# Patient Record
Sex: Female | Born: 1945 | Race: White | Hispanic: No | Smoking: Former smoker
Health system: Southern US, Community
[De-identification: ages and names within clinical notes are randomized; demographics above are authoritative.]

## PROBLEM LIST (undated history)

## (undated) DIAGNOSIS — R112 Nausea with vomiting, unspecified: Secondary | ICD-10-CM

## (undated) DIAGNOSIS — M1990 Unspecified osteoarthritis, unspecified site: Secondary | ICD-10-CM

## (undated) DIAGNOSIS — Z9889 Other specified postprocedural states: Secondary | ICD-10-CM

## (undated) DIAGNOSIS — C801 Malignant (primary) neoplasm, unspecified: Secondary | ICD-10-CM

## (undated) DIAGNOSIS — T8859XA Other complications of anesthesia, initial encounter: Secondary | ICD-10-CM

## (undated) DIAGNOSIS — T4145XA Adverse effect of unspecified anesthetic, initial encounter: Secondary | ICD-10-CM

## (undated) HISTORY — PX: EYE SURGERY: SHX253

## (undated) HISTORY — DX: Malignant (primary) neoplasm, unspecified: C80.1

## (undated) HISTORY — PX: TONSILLECTOMY: SUR1361

---

## 1998-01-29 ENCOUNTER — Other Ambulatory Visit: Admission: RE | Admit: 1998-01-29 | Discharge: 1998-01-29 | Payer: Self-pay | Admitting: Obstetrics and Gynecology

## 1999-02-06 ENCOUNTER — Other Ambulatory Visit: Admission: RE | Admit: 1999-02-06 | Discharge: 1999-02-06 | Payer: Self-pay | Admitting: Obstetrics and Gynecology

## 1999-04-11 ENCOUNTER — Ambulatory Visit (HOSPITAL_COMMUNITY): Admission: RE | Admit: 1999-04-11 | Discharge: 1999-04-11 | Payer: Self-pay | Admitting: Obstetrics and Gynecology

## 1999-04-11 ENCOUNTER — Encounter (INDEPENDENT_AMBULATORY_CARE_PROVIDER_SITE_OTHER): Payer: Self-pay

## 2000-02-20 ENCOUNTER — Other Ambulatory Visit: Admission: RE | Admit: 2000-02-20 | Discharge: 2000-02-20 | Payer: Self-pay | Admitting: Obstetrics and Gynecology

## 2000-05-06 ENCOUNTER — Other Ambulatory Visit: Admission: RE | Admit: 2000-05-06 | Discharge: 2000-05-06 | Payer: Self-pay | Admitting: Obstetrics and Gynecology

## 2000-05-06 ENCOUNTER — Encounter (INDEPENDENT_AMBULATORY_CARE_PROVIDER_SITE_OTHER): Payer: Self-pay | Admitting: Specialist

## 2000-08-16 ENCOUNTER — Other Ambulatory Visit: Admission: RE | Admit: 2000-08-16 | Discharge: 2000-08-16 | Payer: Self-pay | Admitting: Obstetrics and Gynecology

## 2001-02-21 ENCOUNTER — Other Ambulatory Visit: Admission: RE | Admit: 2001-02-21 | Discharge: 2001-02-21 | Payer: Self-pay | Admitting: Obstetrics and Gynecology

## 2002-06-08 ENCOUNTER — Other Ambulatory Visit: Admission: RE | Admit: 2002-06-08 | Discharge: 2002-06-08 | Payer: Self-pay | Admitting: Obstetrics and Gynecology

## 2003-06-08 ENCOUNTER — Other Ambulatory Visit: Admission: RE | Admit: 2003-06-08 | Discharge: 2003-06-08 | Payer: Self-pay | Admitting: Obstetrics and Gynecology

## 2003-09-03 ENCOUNTER — Other Ambulatory Visit: Admission: RE | Admit: 2003-09-03 | Discharge: 2003-09-03 | Payer: Self-pay | Admitting: Obstetrics and Gynecology

## 2005-07-29 ENCOUNTER — Encounter: Admission: RE | Admit: 2005-07-29 | Discharge: 2005-07-29 | Payer: Self-pay | Admitting: Orthopedic Surgery

## 2009-04-27 DIAGNOSIS — C801 Malignant (primary) neoplasm, unspecified: Secondary | ICD-10-CM

## 2009-04-27 HISTORY — PX: AXILLARY SURGERY: SHX892

## 2009-04-27 HISTORY — PX: BREAST SURGERY: SHX581

## 2009-04-27 HISTORY — DX: Malignant (primary) neoplasm, unspecified: C80.1

## 2009-08-29 ENCOUNTER — Encounter: Admission: RE | Admit: 2009-08-29 | Discharge: 2009-08-29 | Payer: Self-pay | Admitting: General Surgery

## 2009-08-30 ENCOUNTER — Encounter: Admission: RE | Admit: 2009-08-30 | Discharge: 2009-08-30 | Payer: Self-pay | Admitting: General Surgery

## 2009-09-10 ENCOUNTER — Ambulatory Visit (HOSPITAL_BASED_OUTPATIENT_CLINIC_OR_DEPARTMENT_OTHER): Admission: RE | Admit: 2009-09-10 | Discharge: 2009-09-11 | Payer: Self-pay | Admitting: General Surgery

## 2009-09-18 ENCOUNTER — Ambulatory Visit: Payer: Self-pay | Admitting: Oncology

## 2009-09-25 LAB — COMPREHENSIVE METABOLIC PANEL
Albumin: 4 g/dL (ref 3.5–5.2)
BUN: 12 mg/dL (ref 6–23)
CO2: 28 mEq/L (ref 19–32)
Calcium: 9.5 mg/dL (ref 8.4–10.5)
Chloride: 102 mEq/L (ref 96–112)
Potassium: 4.2 mEq/L (ref 3.5–5.3)
Total Protein: 6.2 g/dL (ref 6.0–8.3)

## 2009-09-25 LAB — CBC WITH DIFFERENTIAL/PLATELET
Eosinophils Absolute: 0.1 10*3/uL (ref 0.0–0.5)
HCT: 40.6 % (ref 34.8–46.6)
MCHC: 34.7 g/dL (ref 31.5–36.0)
MCV: 91.6 fL (ref 79.5–101.0)
MONO#: 0.7 10*3/uL (ref 0.1–0.9)
NEUT#: 6.6 10*3/uL — ABNORMAL HIGH (ref 1.5–6.5)
NEUT%: 73.5 % (ref 38.4–76.8)
RBC: 4.43 10*6/uL (ref 3.70–5.45)
RDW: 11.8 % (ref 11.2–14.5)

## 2009-10-10 ENCOUNTER — Ambulatory Visit (HOSPITAL_COMMUNITY): Admission: RE | Admit: 2009-10-10 | Discharge: 2009-10-10 | Payer: Self-pay | Admitting: Oncology

## 2009-11-01 ENCOUNTER — Ambulatory Visit: Admission: RE | Admit: 2009-11-01 | Discharge: 2009-12-26 | Payer: Self-pay | Admitting: Radiation Oncology

## 2009-12-25 ENCOUNTER — Ambulatory Visit: Payer: Self-pay | Admitting: Oncology

## 2009-12-27 LAB — CBC WITH DIFFERENTIAL/PLATELET
BASO%: 0.3 % (ref 0.0–2.0)
Basophils Absolute: 0 10*3/uL (ref 0.0–0.1)
EOS%: 2.3 % (ref 0.0–7.0)
MCH: 30.9 pg (ref 25.1–34.0)
MCHC: 33.8 g/dL (ref 31.5–36.0)
MONO#: 0.5 10*3/uL (ref 0.1–0.9)
MONO%: 10.2 % (ref 0.0–14.0)
Platelets: 135 10*3/uL — ABNORMAL LOW (ref 145–400)
RBC: 4.83 10*6/uL (ref 3.70–5.45)
WBC: 4.5 10*3/uL (ref 3.9–10.3)
lymph#: 0.7 10*3/uL — ABNORMAL LOW (ref 0.9–3.3)

## 2010-02-17 ENCOUNTER — Ambulatory Visit: Payer: Self-pay | Admitting: Oncology

## 2010-02-19 LAB — CBC WITH DIFFERENTIAL/PLATELET
BASO%: 0.5 % (ref 0.0–2.0)
Basophils Absolute: 0 10*3/uL (ref 0.0–0.1)
Eosinophils Absolute: 0.2 10*3/uL (ref 0.0–0.5)
HGB: 14.1 g/dL (ref 11.6–15.9)
MCV: 89.9 fL (ref 79.5–101.0)
WBC: 5.1 10*3/uL (ref 3.9–10.3)
lymph#: 1.2 10*3/uL (ref 0.9–3.3)

## 2010-02-19 LAB — COMPREHENSIVE METABOLIC PANEL
AST: 20 U/L (ref 0–37)
Alkaline Phosphatase: 50 U/L (ref 39–117)
CO2: 33 mEq/L — ABNORMAL HIGH (ref 19–32)
Creatinine, Ser: 0.7 mg/dL (ref 0.40–1.20)
Glucose, Bld: 109 mg/dL — ABNORMAL HIGH (ref 70–99)
Total Bilirubin: 1.5 mg/dL — ABNORMAL HIGH (ref 0.3–1.2)

## 2010-02-19 LAB — MORPHOLOGY: PLT EST: ADEQUATE

## 2010-02-19 LAB — CHCC SMEAR

## 2010-02-20 LAB — VITAMIN D 25 HYDROXY (VIT D DEFICIENCY, FRACTURES): Vit D, 25-Hydroxy: 56 ng/mL (ref 30–89)

## 2010-03-26 ENCOUNTER — Ambulatory Visit: Payer: Self-pay | Admitting: Oncology

## 2010-08-21 ENCOUNTER — Ambulatory Visit: Payer: Medicare Other | Attending: Radiation Oncology | Admitting: Radiation Oncology

## 2010-10-06 ENCOUNTER — Encounter (HOSPITAL_BASED_OUTPATIENT_CLINIC_OR_DEPARTMENT_OTHER): Payer: Medicare Other | Admitting: Oncology

## 2010-10-06 ENCOUNTER — Other Ambulatory Visit: Payer: Self-pay | Admitting: Oncology

## 2010-10-06 DIAGNOSIS — Z170 Estrogen receptor positive status [ER+]: Secondary | ICD-10-CM

## 2010-10-06 DIAGNOSIS — C50619 Malignant neoplasm of axillary tail of unspecified female breast: Secondary | ICD-10-CM

## 2010-10-06 LAB — CBC WITH DIFFERENTIAL/PLATELET
BASO%: 0.7 % (ref 0.0–2.0)
Basophils Absolute: 0 10*3/uL (ref 0.0–0.1)
HGB: 14.1 g/dL (ref 11.6–15.9)
MONO%: 8.2 % (ref 0.0–14.0)
NEUT#: 3.2 10*3/uL (ref 1.5–6.5)
NEUT%: 56.5 % (ref 38.4–76.8)
Platelets: 140 10*3/uL — ABNORMAL LOW (ref 145–400)
RBC: 4.65 10*6/uL (ref 3.70–5.45)
lymph#: 1.8 10*3/uL (ref 0.9–3.3)

## 2010-10-06 LAB — COMPREHENSIVE METABOLIC PANEL
AST: 16 U/L (ref 0–37)
Albumin: 4 g/dL (ref 3.5–5.2)
Alkaline Phosphatase: 72 U/L (ref 39–117)
BUN: 9 mg/dL (ref 6–23)
CO2: 30 mEq/L (ref 19–32)
Chloride: 101 mEq/L (ref 96–112)
Creatinine, Ser: 0.72 mg/dL (ref 0.50–1.10)
Glucose, Bld: 85 mg/dL (ref 70–99)
Sodium: 139 mEq/L (ref 135–145)
Total Bilirubin: 0.7 mg/dL (ref 0.3–1.2)

## 2010-10-07 LAB — VITAMIN D 25 HYDROXY (VIT D DEFICIENCY, FRACTURES): Vit D, 25-Hydroxy: 55 ng/mL (ref 30–89)

## 2011-03-31 ENCOUNTER — Other Ambulatory Visit (HOSPITAL_BASED_OUTPATIENT_CLINIC_OR_DEPARTMENT_OTHER): Payer: Medicare Other | Admitting: Lab

## 2011-03-31 ENCOUNTER — Other Ambulatory Visit: Payer: Self-pay | Admitting: Oncology

## 2011-03-31 DIAGNOSIS — C50619 Malignant neoplasm of axillary tail of unspecified female breast: Secondary | ICD-10-CM

## 2011-03-31 DIAGNOSIS — Z170 Estrogen receptor positive status [ER+]: Secondary | ICD-10-CM

## 2011-03-31 LAB — CBC WITH DIFFERENTIAL/PLATELET
Basophils Absolute: 0 10*3/uL (ref 0.0–0.1)
HCT: 44.9 % (ref 34.8–46.6)
MONO#: 0.3 10*3/uL (ref 0.1–0.9)
NEUT#: 3.6 10*3/uL (ref 1.5–6.5)
NEUT%: 70.1 % (ref 38.4–76.8)
RBC: 4.99 10*6/uL (ref 3.70–5.45)
RDW: 12.4 % (ref 11.2–14.5)
lymph#: 1 10*3/uL (ref 0.9–3.3)

## 2011-04-01 LAB — COMPREHENSIVE METABOLIC PANEL
Albumin: 4.3 g/dL (ref 3.5–5.2)
Alkaline Phosphatase: 74 U/L (ref 39–117)
BUN: 13 mg/dL (ref 6–23)
CO2: 28 mEq/L (ref 19–32)
Chloride: 102 mEq/L (ref 96–112)
Sodium: 139 mEq/L (ref 135–145)
Total Bilirubin: 1.4 mg/dL — ABNORMAL HIGH (ref 0.3–1.2)

## 2011-04-01 LAB — VITAMIN D 25 HYDROXY (VIT D DEFICIENCY, FRACTURES): Vit D, 25-Hydroxy: 64 ng/mL (ref 30–89)

## 2011-04-07 ENCOUNTER — Ambulatory Visit (HOSPITAL_BASED_OUTPATIENT_CLINIC_OR_DEPARTMENT_OTHER): Payer: Medicare Other | Admitting: Oncology

## 2011-04-07 DIAGNOSIS — M25519 Pain in unspecified shoulder: Secondary | ICD-10-CM

## 2011-04-07 DIAGNOSIS — C50619 Malignant neoplasm of axillary tail of unspecified female breast: Secondary | ICD-10-CM

## 2011-04-07 DIAGNOSIS — E559 Vitamin D deficiency, unspecified: Secondary | ICD-10-CM

## 2011-04-07 DIAGNOSIS — Z170 Estrogen receptor positive status [ER+]: Secondary | ICD-10-CM

## 2011-04-07 DIAGNOSIS — C50919 Malignant neoplasm of unspecified site of unspecified female breast: Secondary | ICD-10-CM

## 2011-04-07 NOTE — Progress Notes (Signed)
Hematology and Oncology Follow Up Visit  Sarah Lester [id number] 30-May-1945 65 y.o. 04/07/2011 2:23 PM   Principle Diagnosis: 65 year old woman with history of T1 C. N1, grade 1, ER/PR positive breast cancer, status post lumpectomy and radiation completed August 2011, couldn't currently on Arimidex. Interim History:  She is doing well. 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 She appears to be tolerating her Arimidex well with minimal side effects. She will have a followup mammogram in April of next year. Overall review of systems negative  Medications: I have reviewed the patient's current medications.  Allergies: No Known Allergies  Past Medical History, Surgical history, Social history, and Family History were reviewed and updated.  Review of Systems: Constitutional:  Negative for fever, chills, night sweats, anorexia, weight loss, pain. Cardiovascular: no chest pain or dyspnea on exertion Respiratory: no cough, shortness of breath, or wheezing Neurological: no TIA or stroke symptoms Dermatological: negative ENT: negative Skin Gastrointestinal: no abdominal pain, change in bowel habits, or black or bloody stools Genito-Urinary: no dysuria, trouble voiding, or hematuria Hematological and Lymphatic: negative Breast: negative Musculoskeletal: negative Remaining ROS negative.  Physical Exam: Blood pressure 129/70, pulse 73, temperature 98.3 F (36.8 C), temperature source Oral, height 5\' 3"  (1.6 m), weight 126 lb 4.8 oz (57.289 kg). ECOG: 0 General appearance: alert, cooperative and appears stated age Head: Normocephalic, without obvious abnormality, atraumatic Neck: no adenopathy, no carotid bruit, no JVD, supple, symmetrical, trachea midline and thyroid not enlarged, symmetric, no tenderness/mass/nodules Lymph nodes: Cervical, supraclavicular, and axillary nodes normal. Cardiac : Normal Pulmonary: Normal Breasts: Right and left breasts  are free of any masses, there is no nipple retraction or skin changes. Surgical scar is healed well. Both axilla normal Abdomen: Normal Extremities normal  Neuro: Normal  Lab Results: Lab Results  Component Value Date   WBC 5.2 03/31/2011   HGB 15.3 03/31/2011   HCT 44.9 03/31/2011   MCV 89.8 03/31/2011   PLT 147 03/31/2011     Chemistry      Component Value Date/Time   NA 139 03/31/2011 0818   NA 139 03/31/2011 0818   NA 139 03/31/2011 0818   K 4.0 03/31/2011 0818   K 4.0 03/31/2011 0818   K 4.0 03/31/2011 0818   CL 102 03/31/2011 0818   CL 102 03/31/2011 0818   CL 102 03/31/2011 0818   CO2 28 03/31/2011 0818   CO2 28 03/31/2011 0818   CO2 28 03/31/2011 0818   BUN 13 03/31/2011 0818   BUN 13 03/31/2011 0818   BUN 13 03/31/2011 0818   CREATININE 0.79 03/31/2011 0818   CREATININE 0.79 03/31/2011 0818   CREATININE 0.79 03/31/2011 0818      Component Value Date/Time   CALCIUM 9.6 03/31/2011 0818   CALCIUM 9.6 03/31/2011 0818   CALCIUM 9.6 03/31/2011 0818   ALKPHOS 74 03/31/2011 0818   ALKPHOS 74 03/31/2011 0818   ALKPHOS 74 03/31/2011 0818   AST 15 03/31/2011 0818   AST 15 03/31/2011 0818   AST 15 03/31/2011 0818   ALT 15 03/31/2011 0818   ALT 15 03/31/2011 0818   ALT 15 03/31/2011 0818   BILITOT 1.4* 03/31/2011 0818   BILITOT 1.4* 03/31/2011 0818   BILITOT 1.4* 03/31/2011 0818       Radiological Studies: chest X-ray n/a  Mammogram Due in 4/12 Bone density n/a  Impression and Plan: The patient is doing well. See her in 6 months time with appropriate imaging  studies. She is tolerating Arimidex well apart from some shoulder discomfort which may or may not be related to the drug. She is reluctant to take this on an ongoing basis, but I have encouraged her to do so.  More than 50% of the visit was spent in patient-related counselling   Pierce Crane, MD 12/11/20122:23 PM

## 2011-04-28 HISTORY — PX: COLONOSCOPY: SHX174

## 2011-04-29 ENCOUNTER — Other Ambulatory Visit: Payer: Self-pay | Admitting: *Deleted

## 2011-04-29 ENCOUNTER — Telehealth: Payer: Self-pay | Admitting: *Deleted

## 2011-04-29 DIAGNOSIS — E559 Vitamin D deficiency, unspecified: Secondary | ICD-10-CM

## 2011-04-29 DIAGNOSIS — C50919 Malignant neoplasm of unspecified site of unspecified female breast: Secondary | ICD-10-CM

## 2011-04-29 MED ORDER — ANASTROZOLE 1 MG PO TABS: 1.0000 mg | ORAL_TABLET | Freq: Every day | ORAL | Status: DC

## 2011-04-29 NOTE — Telephone Encounter (Signed)
Pt. Called wondering if she can take glucosmaine chrondotin with arimidex?  Discussed with Debbora Presto PA and yes, she can.  Pt. Also said that mail order pharmacy  Will be contacting us re: prescription.  Apparently they have not received the one she mailed in and told her that they would contact us re: prescription.

## 2011-09-29 ENCOUNTER — Other Ambulatory Visit: Payer: Self-pay | Admitting: *Deleted

## 2011-09-29 ENCOUNTER — Other Ambulatory Visit (HOSPITAL_BASED_OUTPATIENT_CLINIC_OR_DEPARTMENT_OTHER): Payer: Medicare Other | Admitting: Lab

## 2011-09-29 DIAGNOSIS — C50619 Malignant neoplasm of axillary tail of unspecified female breast: Secondary | ICD-10-CM

## 2011-09-29 DIAGNOSIS — E559 Vitamin D deficiency, unspecified: Secondary | ICD-10-CM

## 2011-09-29 DIAGNOSIS — C50919 Malignant neoplasm of unspecified site of unspecified female breast: Secondary | ICD-10-CM

## 2011-09-29 DIAGNOSIS — C801 Malignant (primary) neoplasm, unspecified: Secondary | ICD-10-CM

## 2011-09-29 DIAGNOSIS — Z170 Estrogen receptor positive status [ER+]: Secondary | ICD-10-CM

## 2011-09-29 LAB — CBC WITH DIFFERENTIAL/PLATELET
BASO%: 0.8 % (ref 0.0–2.0)
Basophils Absolute: 0 10*3/uL (ref 0.0–0.1)
HCT: 43.5 % (ref 34.8–46.6)
HGB: 14.6 g/dL (ref 11.6–15.9)
MCHC: 33.4 g/dL (ref 31.5–36.0)
MONO#: 0.4 10*3/uL (ref 0.1–0.9)
NEUT%: 60.5 % (ref 38.4–76.8)
RDW: 12.6 % (ref 11.2–14.5)
WBC: 4.7 10*3/uL (ref 3.9–10.3)
lymph#: 1.3 10*3/uL (ref 0.9–3.3)

## 2011-09-29 LAB — COMPREHENSIVE METABOLIC PANEL WITH GFR
ALT: 9 U/L (ref 0–35)
AST: 12 U/L (ref 0–37)
Albumin: 4.1 g/dL (ref 3.5–5.2)
Alkaline Phosphatase: 62 U/L (ref 39–117)
BUN: 12 mg/dL (ref 6–23)
CO2: 29 meq/L (ref 19–32)
Calcium: 9.6 mg/dL (ref 8.4–10.5)
Chloride: 104 meq/L (ref 96–112)
Creatinine, Ser: 0.82 mg/dL (ref 0.50–1.10)
Glucose, Bld: 56 mg/dL — ABNORMAL LOW (ref 70–99)
Potassium: 4.1 meq/L (ref 3.5–5.3)
Sodium: 142 meq/L (ref 135–145)
Total Bilirubin: 1 mg/dL (ref 0.3–1.2)
Total Protein: 6.3 g/dL (ref 6.0–8.3)

## 2011-09-29 LAB — VITAMIN D 25 HYDROXY (VIT D DEFICIENCY, FRACTURES): Vit D, 25-Hydroxy: 52 ng/mL (ref 30–89)

## 2011-10-06 ENCOUNTER — Ambulatory Visit: Payer: Medicare Other | Admitting: Oncology

## 2011-10-07 ENCOUNTER — Telehealth: Payer: Self-pay | Admitting: *Deleted

## 2011-10-07 ENCOUNTER — Other Ambulatory Visit: Payer: Self-pay | Admitting: *Deleted

## 2011-10-07 DIAGNOSIS — C50919 Malignant neoplasm of unspecified site of unspecified female breast: Secondary | ICD-10-CM

## 2011-10-07 NOTE — Telephone Encounter (Signed)
confirmed over the phone for 10-30-2011 starting at 8:30am

## 2011-10-30 ENCOUNTER — Other Ambulatory Visit (HOSPITAL_BASED_OUTPATIENT_CLINIC_OR_DEPARTMENT_OTHER): Payer: Medicare Other | Admitting: Lab

## 2011-10-30 ENCOUNTER — Telehealth: Payer: Self-pay | Admitting: *Deleted

## 2011-10-30 ENCOUNTER — Ambulatory Visit (HOSPITAL_BASED_OUTPATIENT_CLINIC_OR_DEPARTMENT_OTHER): Payer: Medicare Other | Admitting: Oncology

## 2011-10-30 VITALS — BP 104/64 | HR 78 | Temp 98.7°F | Ht 63.0 in | Wt 124.0 lb

## 2011-10-30 DIAGNOSIS — E559 Vitamin D deficiency, unspecified: Secondary | ICD-10-CM

## 2011-10-30 DIAGNOSIS — C50619 Malignant neoplasm of axillary tail of unspecified female breast: Secondary | ICD-10-CM

## 2011-10-30 DIAGNOSIS — C50919 Malignant neoplasm of unspecified site of unspecified female breast: Secondary | ICD-10-CM

## 2011-10-30 DIAGNOSIS — Z170 Estrogen receptor positive status [ER+]: Secondary | ICD-10-CM

## 2011-10-30 DIAGNOSIS — C801 Malignant (primary) neoplasm, unspecified: Secondary | ICD-10-CM

## 2011-10-30 LAB — CBC WITH DIFFERENTIAL/PLATELET
BASO%: 0.8 % (ref 0.0–2.0)
EOS%: 1.5 % (ref 0.0–7.0)
HCT: 42.8 % (ref 34.8–46.6)
MCH: 30.1 pg (ref 25.1–34.0)
MCHC: 33.7 g/dL (ref 31.5–36.0)
MONO#: 0.7 10*3/uL (ref 0.1–0.9)
NEUT%: 64.1 % (ref 38.4–76.8)
RDW: 12.6 % (ref 11.2–14.5)
WBC: 6.4 10*3/uL (ref 3.9–10.3)
lymph#: 1.5 10*3/uL (ref 0.9–3.3)

## 2011-10-30 LAB — CANCER ANTIGEN 27.29: CA 27.29: 16 U/mL (ref 0–39)

## 2011-10-30 NOTE — Telephone Encounter (Signed)
mailed out patient's calendar on 10-30-2011

## 2011-10-30 NOTE — Telephone Encounter (Signed)
Made patient appointment for bone density at [facility] for 11-13-2011 arrival time 10:15am called dr.streck office to inform them dr.rubin would like the patient to see dr.streck before 01-06-2012

## 2011-10-30 NOTE — Progress Notes (Signed)
Hematology and Oncology Follow Up Visit  Sarah Lester [id number] June 16, 1945 66 y.o. 10/30/2011 9:05 AM   Principle Diagnosis: 66 year old woman with history of T1 C. N1, grade 1, ER/PR positive breast cancer, status post lumpectomy and radiation completed August 2011, couldn't currently on Arimidex. Interim History:  She is doing well. 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 She appears to be tolerating her Arimidex well with minimal side effects. She will have a followup mammogram in April of next year. Overall review of systems negative  Medications: I have reviewed the patient's current medications.  Allergies: No Known Allergies  Past Medical History, Surgical history, Social history, and Family History were reviewed and updated.  Review of Systems: Constitutional:  Negative for fever, chills, night sweats, anorexia, weight loss, pain. Cardiovascular: no chest pain or dyspnea on exertion Respiratory: no cough, shortness of breath, or wheezing Neurological: no TIA or stroke symptoms Dermatological: negative ENT: negative Skin Gastrointestinal: no abdominal pain, change in bowel habits, or black or bloody stools Genito-Urinary: no dysuria, trouble voiding, or hematuria Hematological and Lymphatic: negative Breast: negative Musculoskeletal: negative Remaining ROS negative.  Physical Exam: Blood pressure 104/64, pulse 78, temperature 98.7 F (37.1 C), temperature source Oral, height 5\' 3"  (1.6 m), weight 124 lb (56.246 kg). ECOG: 0 General appearance: alert, cooperative and appears stated age Head: Normocephalic, without obvious abnormality, atraumatic Neck: no adenopathy, no carotid bruit, no JVD, supple, symmetrical, trachea midline and thyroid not enlarged, symmetric, no tenderness/mass/nodules Lymph nodes: Cervical, supraclavicular, and axillary nodes normal. Cardiac : Normal Pulmonary: Normal Breasts: Right and left breasts are free  of any masses, there is nipple retraction on the lt breast , which has been there before, no skin changes. Surgical scar is healed well. Both axilla normal Abdomen: Normal Extremities normal  Neuro: Normal  Lab Results: Lab Results  Component Value Date   WBC 6.4 10/30/2011   HGB 14.4 10/30/2011   HCT 42.8 10/30/2011   MCV 89.4 10/30/2011   PLT 141* 10/30/2011     Chemistry      Component Value Date/Time   NA 142 09/29/2011 0833   K 4.1 09/29/2011 0833   CL 104 09/29/2011 0833   CO2 29 09/29/2011 0833   BUN 12 09/29/2011 0833   CREATININE 0.82 09/29/2011 0833      Component Value Date/Time   CALCIUM 9.6 09/29/2011 0833   ALKPHOS 62 09/29/2011 0833   AST 12 09/29/2011 0833   ALT 9 09/29/2011 0833   BILITOT 1.0 09/29/2011 3086       Radiological Studies: chest X-ray n/a  Mammogram Due in 4/12 Bone density n/a  Impression and Plan: The patient is doing well. See her in 6 months time with appropriate imaging studies. She is tolerating Arimidex well apart from some shoulder discomfort which may or may not be related to the drug. She also notes some left-sided hip pain which is intermittent. She appears to be tolerating the Arimidex fairly well. I reviewed her MR S. today. Overall score is good. We reviewed some of her issues including anxiety stress poor sleep and some hot flashes. She is reluctant to take medication for this and does not feel that she is depressed. I also reviewed her BMI and vital signs. These are excellent. I will see her in 6 months time. More than 50% of the visit was spent in patient-related counselling   Pierce Crane, MD 7/5/20139:05 AM

## 2012-02-23 ENCOUNTER — Telehealth: Payer: Self-pay | Admitting: *Deleted

## 2012-02-23 NOTE — Telephone Encounter (Signed)
Mailed out letter and calendar to inform the patient of the new date and time on 2014

## 2012-03-07 ENCOUNTER — Encounter: Payer: Self-pay | Admitting: Gastroenterology

## 2012-04-05 ENCOUNTER — Ambulatory Visit (AMBULATORY_SURGERY_CENTER): Payer: Medicare Other | Admitting: *Deleted

## 2012-04-05 VITALS — Ht 63.5 in | Wt 127.0 lb

## 2012-04-05 DIAGNOSIS — Z1211 Encounter for screening for malignant neoplasm of colon: Secondary | ICD-10-CM

## 2012-04-05 MED ORDER — MOVIPREP 100 G PO SOLR: ORAL | Status: DC

## 2012-04-06 ENCOUNTER — Encounter: Payer: Self-pay | Admitting: Gastroenterology

## 2012-04-22 ENCOUNTER — Encounter: Payer: Self-pay | Admitting: Gastroenterology

## 2012-04-22 ENCOUNTER — Ambulatory Visit (AMBULATORY_SURGERY_CENTER): Payer: Medicare Other | Admitting: Gastroenterology

## 2012-04-22 ENCOUNTER — Telehealth: Payer: Self-pay | Admitting: *Deleted

## 2012-04-22 VITALS — BP 106/64 | HR 62 | Temp 97.2°F | Resp 26 | Ht 63.5 in | Wt 127.0 lb

## 2012-04-22 DIAGNOSIS — Z1211 Encounter for screening for malignant neoplasm of colon: Secondary | ICD-10-CM

## 2012-04-22 DIAGNOSIS — K5730 Diverticulosis of large intestine without perforation or abscess without bleeding: Secondary | ICD-10-CM

## 2012-04-22 MED ORDER — SODIUM CHLORIDE 0.9 % IV SOLN: 500.0000 mL | INTRAVENOUS | Status: DC

## 2012-04-22 NOTE — Progress Notes (Signed)
Pressure applied to the abdomen to reach the cecum 

## 2012-04-22 NOTE — Op Note (Signed)
[facility] [address] [place], [address]   COLONOSCOPY PROCEDURE REPORT  PATIENT: Sarah Lester, Sarah Lester  MR#: [id number] BIRTHDATE: 1945-06-05 , 66  yrs. old GENDER: Female ENDOSCOPIST: Rachael Fee, MD REFERRED [id number] Ricki Miller, M.D. PROCEDURE DATE:  04/22/2012 PROCEDURE:   Colonoscopy, screening ASA CLASS:   Class II INDICATIONS:average risk screening. MEDICATIONS: Fentanyl 50 mcg IV, Versed 6 mg IV, and These medications were titrated to patient response per physician's verbal order  DESCRIPTION OF PROCEDURE:   After the risks benefits and alternatives of the procedure were thoroughly explained, informed consent was obtained.  A digital rectal exam revealed no abnormalities of the rectum.   The Fuse-Demo-Scope  endoscope was introduced through the anus and advanced to the cecum, which was identified by both the appendix and ileocecal valve. No adverse events experienced.   The quality of the prep was good, using MoviPrep  The instrument was then slowly withdrawn as the colon was fully examined.   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  The examination was otherwise normal.   Images taken but only available in hard copy form that will be scanned into EPIC [employer]).  Retroflexed views revealed no abnormalities. The time to cecum=5 minutes 59 seconds.  Withdrawal time=8 minutes 52 seconds.  The scope was withdrawn and the procedure completed. COMPLICATIONS: There were no complications.  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 The examination was otherwise normal. Images taken but only available in hard copy form that will be scanned into EPIC [employer]).  RECOMMENDATIONS: You should continue to follow colorectal cancer screening guidelines for "routine risk" patients with a repeat colonoscopy in 10 years. There is no need for FOBT (stool) testing for at least 5  years.   eSigned:  Rachael Fee, MD 04/22/2012 10:29 AM

## 2012-04-22 NOTE — Patient Instructions (Addendum)
YOU HAD AN ENDOSCOPIC PROCEDURE TODAY AT THE [facility]: Refer to the procedure report that was given to you for any specific questions about what was found during the examination.  If the procedure report does not answer your questions, please call your gastroenterologist to clarify.  If you requested that your care partner not be given the details of your procedure findings, then the procedure report has been included in a sealed envelope for you to review at your convenience later.  YOU SHOULD EXPECT: Some feelings of bloating in the abdomen. Passage of more gas than usual.  Walking can help get rid of the air that was put into your GI tract during the procedure and reduce the bloating. If you had a lower endoscopy (such as a colonoscopy or flexible sigmoidoscopy) you may notice spotting of blood in your stool or on the toilet paper. If you underwent a bowel prep for your procedure, then you may not have a normal bowel movement for a few days.  DIET: Your first meal following the procedure should be a light meal and then it is ok to progress to your normal diet.  A half-sandwich or bowl of soup is an example of a good first meal.  Heavy or fried foods are harder to digest and may make you feel nauseous or bloated.  Likewise meals heavy in dairy and vegetables can cause extra gas to form and this can also increase the bloating.  Drink plenty of fluids but you should avoid alcoholic beverages for 24 hours.  ACTIVITY: Your care partner should take you home directly after the procedure.  You should plan to take it easy, moving slowly for the rest of the day.  You can resume normal activity the day after the procedure however you should NOT DRIVE or use heavy machinery for 24 hours (because of the sedation medicines used during the test).    SYMPTOMS TO REPORT IMMEDIATELY: A gastroenterologist can be reached at any hour.  During normal business hours, 8:30 AM to 5:00 PM Monday through Friday,  call [phone].  After hours and on weekends, please call the GI answering service at [phone] who will take a message and have the physician on call contact you.   Following lower endoscopy (colonoscopy or flexible sigmoidoscopy):  Excessive amounts of blood in the stool  Significant tenderness or worsening of abdominal pains  Swelling of the abdomen that is new, acute  Fever of 100F or higher  FOLLOW UP: If any biopsies were taken you will be contacted by phone or by letter within the next 1-3 weeks.  Call your gastroenterologist if you have not heard about the biopsies in 3 weeks.  Our staff will call the home number listed on your records the next business day following your procedure to check on you and address any questions or concerns that you may have at that time regarding the information given to you following your procedure. This is a courtesy call and so if there is no answer at the home number and we have not heard from you through the emergency physician on call, we will assume that you have returned to your regular daily activities without incident.  SIGNATURES/CONFIDENTIALITY: You and/or your care partner have signed paperwork which will be entered into your electronic medical record.  These signatures attest to the fact that that the information above on your After Visit Summary has been reviewed and is understood.  Full responsibility of the confidentiality of this   discharge information lies with you and/or your care-partner.   Thank-you for choosing us for your healthcare needs. 

## 2012-04-22 NOTE — Progress Notes (Signed)
Patient did not have preoperative order for IV antibiotic SSI prophylaxis. [phone])  Patient did not experience any of the following events: a burn prior to discharge; a fall within the facility; wrong site/side/patient/procedure/implant event; or a hospital transfer or hospital admission upon discharge from the facility. [phone])  Patient refused to pass gas on her own;   Refused to open eyes when she had been here for 20 mins; spit out levsin.  Rectal tube inserted and relief noted.   Patient still groggy, but able to get into wheelchair almost by herself.  Son wi;l;l take her home and put her to bed.

## 2012-04-22 NOTE — Telephone Encounter (Signed)
Patient confirmed over the phone the appointment has been cancelled will call back with an later date and appointment

## 2012-04-25 ENCOUNTER — Other Ambulatory Visit: Payer: Self-pay | Admitting: *Deleted

## 2012-04-25 ENCOUNTER — Encounter: Payer: Self-pay | Admitting: Gastroenterology

## 2012-04-25 ENCOUNTER — Telehealth: Payer: Self-pay | Admitting: *Deleted

## 2012-04-25 NOTE — Telephone Encounter (Signed)
  Follow up Call-  Call back number 04/22/2012  Post procedure Call Back phone  # [phone] cell  Permission to leave phone message Yes     Patient questions:  Do you have a fever, pain , or abdominal swelling? no Pain Score  0 *  Have you tolerated food without any problems? yes  Have you been able to return to your normal activities? yes  Do you have any questions about your discharge instructions: Diet   no Medications  no Follow up visit  no  Do you have questions or concerns about your Care? no  Actions: * If pain score is 4 or above: No action needed, pain <4.

## 2012-04-26 ENCOUNTER — Other Ambulatory Visit: Payer: Self-pay | Admitting: *Deleted

## 2012-04-26 DIAGNOSIS — C50919 Malignant neoplasm of unspecified site of unspecified female breast: Secondary | ICD-10-CM

## 2012-04-26 DIAGNOSIS — E559 Vitamin D deficiency, unspecified: Secondary | ICD-10-CM

## 2012-04-26 MED ORDER — ANASTROZOLE 1 MG PO TABS: 1.0000 mg | ORAL_TABLET | Freq: Every day | ORAL | Status: DC

## 2012-05-03 ENCOUNTER — Other Ambulatory Visit: Payer: Medicare Other | Admitting: Lab

## 2012-05-10 ENCOUNTER — Ambulatory Visit: Payer: Medicare Other | Admitting: Oncology

## 2012-05-11 ENCOUNTER — Other Ambulatory Visit: Payer: Medicare Other | Admitting: Lab

## 2012-05-18 ENCOUNTER — Ambulatory Visit: Payer: Medicare Other | Admitting: Oncology

## 2012-05-27 ENCOUNTER — Encounter: Payer: Self-pay | Admitting: Oncology

## 2012-05-27 ENCOUNTER — Telehealth: Payer: Self-pay | Admitting: *Deleted

## 2012-05-27 NOTE — Telephone Encounter (Signed)
Pt called about her appt and I confirmed 06/03/12 appt w/ pt.  Mailed letter & calendar to pt.

## 2012-06-02 ENCOUNTER — Telehealth: Payer: Self-pay | Admitting: Oncology

## 2012-06-02 NOTE — Telephone Encounter (Signed)
Returned pts call.  Left my direct number for a call back.

## 2012-06-03 ENCOUNTER — Ambulatory Visit (HOSPITAL_BASED_OUTPATIENT_CLINIC_OR_DEPARTMENT_OTHER): Payer: Medicare Other | Admitting: Family

## 2012-06-03 ENCOUNTER — Encounter: Payer: Self-pay | Admitting: Family

## 2012-06-03 ENCOUNTER — Telehealth: Payer: Self-pay | Admitting: Oncology

## 2012-06-03 VITALS — BP 108/69 | HR 89 | Temp 97.5°F | Resp 20 | Ht 63.5 in | Wt 128.1 lb

## 2012-06-03 DIAGNOSIS — C50911 Malignant neoplasm of unspecified site of right female breast: Secondary | ICD-10-CM | POA: Insufficient documentation

## 2012-06-03 DIAGNOSIS — E559 Vitamin D deficiency, unspecified: Secondary | ICD-10-CM

## 2012-06-03 DIAGNOSIS — C50619 Malignant neoplasm of axillary tail of unspecified female breast: Secondary | ICD-10-CM

## 2012-06-03 DIAGNOSIS — Z170 Estrogen receptor positive status [ER+]: Secondary | ICD-10-CM

## 2012-06-03 DIAGNOSIS — C50919 Malignant neoplasm of unspecified site of unspecified female breast: Secondary | ICD-10-CM

## 2012-06-03 MED ORDER — ANASTROZOLE 1 MG PO TABS: 1.0000 mg | ORAL_TABLET | Freq: Every day | ORAL | Status: DC

## 2012-06-03 NOTE — Patient Instructions (Addendum)
Please contact us at [phone] if you have any questions or concerns. 

## 2012-06-03 NOTE — Progress Notes (Signed)
[facility]  Telephone:([phone]) [phone] Fax:([phone]) [phone]  OFFICE PROGRESS NOTE  PATIENT: Sarah Lester   DOB: 1945/12/23  MR#: [id number]  JYN#:[id number]  QM:VHQI,ONGEXBM, MD  DIAGNOSIS: Sarah Lester is a 67 year-old [place] woman who is status post right lumpectomy and axillary lymph node dissection on 08/31/2009 (with concurrent left retroareolar excisional biopsy of an area of benign ductal ectasia) for a T1c N1 invasive ductal carcinoma, Grade 1, with negative margins and no evidence of lymphovascular invasion. The final pathology revealed a 1.2 cm, grade 1 invasive ductal carcinoma with metastatic carcinoma in 2 of 8 lymph nodes.  ER 97%, PR 81%, Ki-67 10%, HER-2/neu by CISH no amplification was detected, the ratio was 1.29.  She has taken Arimidex since 12/2009.    PRIOR THERAPY: 1.  The patient is status post right lumpectomy and axillary lymph node dissection with concurrent left retroareolar excisional biopsy of an area of benign ductal ectasia on 08/31/2009.  2. The patient had Oncotype DX sent on the tumor specimen. The Oncotype DX score was 13 with a 5 year risk of recurrence of 8%.  The patient did not require chemotherapy based on Oncotype results.  3. The patient is status post radiation therapy from 11/14/2009 through 12/25/2009.  4. The patient began Arimidex 1 mg by mouth daily in 12/2009.  5. Of note, the patient had 2 axillary biopsies by Dr. Lurene Shadow over 20 years ago. Both of the lesions were benign but in the same area as the patient's invasive ductal carcinoma.   CURRENT THERAPY:    Arimidex 1 mg by mouth daily.    INTERVAL HISTORY: Dr. Welton Flakes and I saw Sarah Lester is a 67 year-old [place] woman today for follow up for invasive ductal carcinoma. She was last seen by Dr. Donnie Coffin on 10/30/2011. Sarah Lester was upset that Dr. Donnie Coffin is no longer at [facility]. It was offered to Sarah Lester an opportunity to speak with the Director  and/or the [employer] of the [employer], but at the end of her office visit, the patient stated that she needed to get to work. Since her last office visit the patient denies any symptomatology including no hospitalizations, ER visits, breast changes, shortness of breath, nausea/vomiting/diarrhea/constipation, fever, chills, night sweats, hot flashes or vaginal dryness. She continues to work full time at an [employer] in [place], [place].  She is very physically active and likes to do Yoga.    PAST MEDICAL HISTORY: Past Medical History  Diagnosis Date  . Cancer 2011     Right axillary    PAST SURGICAL HISTORY: Past Surgical History  Procedure Date  . Axillary surgery 2011    cancer/radiation Tx  . Cesarean section 1978    GYNECOLOGIC HISTORY:  Gravida 4, part 3, menarche age 11. She has previously used hormone replacement therapy.    FAMILY HISTORY: Family History  Problem Relation Age of Onset  . Colon cancer Neg Hx   . Esophageal cancer Neg Hx   . Rectal cancer Neg Hx   . Stomach cancer Neg Hx   . Alzheimer's disease Mother     SOCIAL HISTORY: History  Substance Use Topics  . Smoking status: Former Games developer  . Smokeless tobacco: Never Used  . Alcohol Use: 8.4 oz/week    14 Glasses of wine per week    ALLERGIES: No Known Allergies   MEDICATIONS:  Current Outpatient Prescriptions  Medication Sig Dispense Refill  . anastrozole (ARIMIDEX) 1 MG tablet  Take 1 tablet (1 mg total) by mouth daily.  90 tablet  prn  . calcium carbonate 200 MG capsule Take 1,000 mg by mouth 2 (two) times daily with a meal.        . cholecalciferol (VITAMIN D) 1000 UNITS tablet Take 1,000 Units by mouth daily.            REVIEW OF SYSTEMS: A 10 point review of systems was completed and is negative.    PHYSICAL EXAMINATION: BP 108/69  Pulse 89  Temp 97.5 F (36.4 C)  Resp 20  Ht 5' 3.5" (1.613 m)  Wt 128 lb 1.6 oz (58.106 kg)  BMI 22.34 kg/m2    General appearance: Alert, cooperative, well nourished, moderate distress Head: Normocephalic, without obvious abnormality, atraumatic Eyes: Conjunctivae/corneas clear, PERRLA, EOMI Nose: Nares, septum and mucosa are normal, no drainage or sinus tenderness Neck: No adenopathy, supple, symmetrical, trachea midline, thyroid not enlarged, no tenderness Resp: Clear to auscultation bilaterally Cardio: Regular rate and rhythm, S1, S2 normal, no murmur, click, rub or gallop Breasts: Soft bilaterally, well-healed surgical scars, no lymphadenopathy, left breast nipple retraction, no axilla fullness, benign breast exam GI: Soft, not distended, non-tender, hypoactive bowel sounds, no organomegaly Extremities: Extremities normal, atraumatic, no cyanosis or edema Lymph nodes: Cervical, supraclavicular, and axillary nodes normal Neurologic: Grossly normal    ECOG FS:  Grade 0 - Fully active            LAB RESULTS: Lab Results  Component Value Date   WBC 6.4 10/30/2011   NEUTROABS 4.1 10/30/2011   HGB 14.4 10/30/2011   HCT 42.8 10/30/2011   MCV 89.4 10/30/2011   PLT 141* 10/30/2011      Chemistry      Component Value Date/Time   NA 142 09/29/2011 0833   K 4.1 09/29/2011 0833   CL 104 09/29/2011 0833   CO2 29 09/29/2011 0833   BUN 12 09/29/2011 0833   CREATININE 0.82 09/29/2011 0833      Component Value Date/Time   CALCIUM 9.6 09/29/2011 0833   ALKPHOS 62 09/29/2011 0833   AST 12 09/29/2011 0833   ALT 9 09/29/2011 0833   BILITOT 1.0 09/29/2011 0833       Lab Results  Component Value Date   LABCA2 16 10/30/2011    RADIOGRAPHIC STUDIES: No results found.  ASSESSMENT: Sarah Lester is a 67 y.o. [place] woman: She is status post right lumpectomy and axillary lymph node dissection on 08/31/2009 (with concurrent left retroareolar excisional biopsy of an area of benign ductal ectasia) for a T1c N1 invasive ductal carcinoma, Grade 1, with negative margins and no evidence of lymphovascular invasion. The final  pathology revealed a 1.2 cm, grade 1 invasive ductal carcinoma with metastatic carcinoma in 2 of 8 lymph nodes.  ER 97%, PR 81%, Ki-67 10%, HER-2/neu by CISH no amplification was detected, the ratio was 1.29.  She has taken Arimidex since 12/2009 and is tolerating it well.  PLAN: 1.  The patient will continue taking Arimidex 1 mg by mouth daily until 12/2014.  She was given a prescription for Arimidex   #90 with PRN refills, today.   2.  The patient will come back to complete laboratories of CMP, CBC, and vitamin D on 06/06/2012.  The patient stated she did not have time to complete the laboratories today because she had to get to work.  3. The patient will return for a follow up visit with Dr. Welton Flakes in 6 months on 11/30/2012 with laboratories  of CBC, CMP, and vitamin D to be obtained at that time.  4. The patient's last mammogram on 08/25/2011 showed stable architectural distortion at the lumpectomy site on the right and also the left retroareolar area. Benign findings.  She is due for another diagnostic mammogram in 07/2012.   5. The patient was offered the opportunity to voice her concerns about Dr. Renelda Loma departure to the Director and/or Warehouse manager of [facility].  All questions were answered.  The patient was encouraged to contact us in the interim with any problems, questions or concerns.    Larina Bras, NP-C 06/03/2012, 7:06 PM

## 2012-06-06 ENCOUNTER — Other Ambulatory Visit (HOSPITAL_BASED_OUTPATIENT_CLINIC_OR_DEPARTMENT_OTHER): Payer: Medicare Other | Admitting: Lab

## 2012-06-06 DIAGNOSIS — E559 Vitamin D deficiency, unspecified: Secondary | ICD-10-CM

## 2012-06-06 DIAGNOSIS — C50919 Malignant neoplasm of unspecified site of unspecified female breast: Secondary | ICD-10-CM

## 2012-06-06 LAB — COMPREHENSIVE METABOLIC PANEL (CC13)
ALT: 13 U/L (ref 0–55)
Albumin: 3.7 g/dL (ref 3.5–5.0)
Alkaline Phosphatase: 84 U/L (ref 40–150)
CO2: 29 mEq/L (ref 22–29)
Potassium: 4.2 mEq/L (ref 3.5–5.1)
Sodium: 142 mEq/L (ref 136–145)
Total Bilirubin: 1.63 mg/dL — ABNORMAL HIGH (ref 0.20–1.20)
Total Protein: 6.7 g/dL (ref 6.4–8.3)

## 2012-06-06 LAB — CBC WITH DIFFERENTIAL/PLATELET
EOS%: 4.6 % (ref 0.0–7.0)
Eosinophils Absolute: 0.2 10*3/uL (ref 0.0–0.5)
HGB: 14.7 g/dL (ref 11.6–15.9)
MCV: 88.6 fL (ref 79.5–101.0)
MONO%: 9.5 % (ref 0.0–14.0)
NEUT#: 2.2 10*3/uL (ref 1.5–6.5)
RBC: 4.99 10*6/uL (ref 3.70–5.45)
RDW: 12.6 % (ref 11.2–14.5)
lymph#: 1.4 10*3/uL (ref 0.9–3.3)

## 2012-06-07 LAB — VITAMIN D 25 HYDROXY (VIT D DEFICIENCY, FRACTURES): Vit D, 25-Hydroxy: 46 ng/mL (ref 30–89)

## 2012-10-11 ENCOUNTER — Other Ambulatory Visit: Payer: Self-pay | Admitting: Orthopedic Surgery

## 2012-10-11 DIAGNOSIS — M549 Dorsalgia, unspecified: Secondary | ICD-10-CM

## 2012-10-20 ENCOUNTER — Ambulatory Visit
Admission: RE | Admit: 2012-10-20 | Discharge: 2012-10-20 | Disposition: A | Discharge status: Home / Self Care | Payer: Medicare Other | Source: Ambulatory Visit | Attending: Orthopedic Surgery | Admitting: Orthopedic Surgery

## 2012-10-20 DIAGNOSIS — M549 Dorsalgia, unspecified: Secondary | ICD-10-CM

## 2012-11-30 ENCOUNTER — Encounter: Payer: Self-pay | Admitting: Oncology

## 2012-11-30 ENCOUNTER — Other Ambulatory Visit (HOSPITAL_BASED_OUTPATIENT_CLINIC_OR_DEPARTMENT_OTHER): Payer: Medicare Other | Admitting: Lab

## 2012-11-30 ENCOUNTER — Ambulatory Visit (HOSPITAL_BASED_OUTPATIENT_CLINIC_OR_DEPARTMENT_OTHER): Payer: Medicare Other | Admitting: Oncology

## 2012-11-30 ENCOUNTER — Telehealth: Payer: Self-pay | Admitting: *Deleted

## 2012-11-30 VITALS — BP 116/68 | HR 81 | Temp 98.3°F | Resp 20 | Ht 63.5 in | Wt 129.3 lb

## 2012-11-30 DIAGNOSIS — C50619 Malignant neoplasm of axillary tail of unspecified female breast: Secondary | ICD-10-CM

## 2012-11-30 DIAGNOSIS — C50919 Malignant neoplasm of unspecified site of unspecified female breast: Secondary | ICD-10-CM

## 2012-11-30 DIAGNOSIS — E559 Vitamin D deficiency, unspecified: Secondary | ICD-10-CM

## 2012-11-30 LAB — CBC WITH DIFFERENTIAL/PLATELET
Eosinophils Absolute: 0.2 10*3/uL (ref 0.0–0.5)
HCT: 43.1 % (ref 34.8–46.6)
LYMPH%: 25.2 % (ref 14.0–49.7)
MCV: 90.2 fL (ref 79.5–101.0)
MONO%: 5.6 % (ref 0.0–14.0)
NEUT#: 4.5 10*3/uL (ref 1.5–6.5)
NEUT%: 65.1 % (ref 38.4–76.8)
Platelets: 152 10*3/uL (ref 145–400)
RBC: 4.78 10*6/uL (ref 3.70–5.45)

## 2012-11-30 LAB — COMPREHENSIVE METABOLIC PANEL (CC13)
ALT: 9 U/L (ref 0–55)
BUN: 12.3 mg/dL (ref 7.0–26.0)
CO2: 24 mEq/L (ref 22–29)
Calcium: 9.7 mg/dL (ref 8.4–10.4)
Creatinine: 0.9 mg/dL (ref 0.6–1.1)
Glucose: 99 mg/dl (ref 70–140)
Total Bilirubin: 1.64 mg/dL — ABNORMAL HIGH (ref 0.20–1.20)

## 2012-11-30 NOTE — Telephone Encounter (Signed)
appts made and printed...td 

## 2012-12-01 LAB — VITAMIN D 25 HYDROXY (VIT D DEFICIENCY, FRACTURES): Vit D, 25-Hydroxy: 64 ng/mL (ref 30–89)

## 2012-12-18 NOTE — Progress Notes (Signed)
[facility]  Telephone:(336) [phone] Fax:(336) [phone]  OFFICE PROGRESS NOTE  PATIENT: Sarah Lester Sarah Lester   DOB: April 07, 1946  MR#: [id number]  BMW#:[id number]  UV:OZDG,UYQIHKV, MD  DIAGNOSIS: Sarah Lester is a 67 year-old [place] woman who is status post right lumpectomy and axillary lymph node dissection on 08/31/2009 (with concurrent left retroareolar excisional biopsy of an area of benign ductal ectasia) for a T1c N1 invasive ductal carcinoma, Grade 1, with negative margins and no evidence of lymphovascular invasion. The final pathology revealed a 1.2 cm, grade 1 invasive ductal carcinoma with metastatic carcinoma in 2 of 8 lymph nodes.  ER 97%, PR 81%, Ki-67 10%, HER-2/neu by CISH no amplification was detected, the ratio was 1.29.  She has taken Arimidex since 12/2009.    PRIOR THERAPY: 1.  The patient is status post right lumpectomy and axillary lymph node dissection with concurrent left retroareolar excisional biopsy of an area of benign ductal ectasia on 08/31/2009.  2. The patient had Oncotype DX sent on the tumor specimen. The Oncotype DX score was 13 with a 5 year risk of recurrence of 8%.  The patient did not require chemotherapy based on Oncotype results.  3. The patient is status post radiation therapy from 11/14/2009 through 12/25/2009.  4. The patient began Arimidex 1 mg by mouth daily in 12/2009.  5. Of note, the patient had 2 axillary biopsies by Dr. Lurene Shadow over 20 years ago. Both of the lesions were benign but in the same area as the patient's invasive ductal carcinoma.   CURRENT THERAPY:    Arimidex 1 mg by mouth daily.    INTERVAL HISTORY:  Sarah Lester is a 67 year-old [place] woman today for follow up for invasive ductal carcinoma..Since her last office visit the patient denies any symptomatology including no hospitalizations, ER visits, breast changes, shortness of breath, nausea/vomiting/diarrhea/constipation, fever, chills, night sweats, hot  flashes or vaginal dryness. She continues to work full time at an [employer] in [place], [place].  She is very physically active and likes to do Yoga.    PAST MEDICAL HISTORY: Past Medical History  Diagnosis Date  . Cancer 2011     Right axillary    PAST SURGICAL HISTORY: Past Surgical History  Procedure Laterality Date  . Axillary surgery  2011    cancer/radiation Tx  . Cesarean section  1978    GYNECOLOGIC HISTORY:  Gravida 4, part 3, menarche age 66. She has previously used hormone replacement therapy.    FAMILY HISTORY: Family History  Problem Relation Age of Onset  . Colon cancer Neg Hx   . Esophageal cancer Neg Hx   . Rectal cancer Neg Hx   . Stomach cancer Neg Hx   . Alzheimer's disease Mother     SOCIAL HISTORY: History  Substance Use Topics  . Smoking status: Former Games developer  . Smokeless tobacco: Never Used  . Alcohol Use: 8.4 oz/week    14 Glasses of wine per week    ALLERGIES: No Known Allergies   MEDICATIONS:  Current Outpatient Prescriptions  Medication Sig Dispense Refill  . anastrozole (ARIMIDEX) 1 MG tablet Take 1 tablet (1 mg total) by mouth daily.  90 tablet  prn  . cholecalciferol (VITAMIN D) 1000 UNITS tablet Take 1,000 Units by mouth daily.        Marland Kitchen OVER THE COUNTER MEDICATION Take 1,000 mg by mouth daily. Coral Calcium wit D3 and magnesium       No current facility-administered medications for this visit.  REVIEW OF SYSTEMS: A 10 point review of systems was completed and is negative.    PHYSICAL EXAMINATION: BP 116/68  Pulse 81  Temp(Src) 98.3 F (36.8 C) (Oral)  Resp 20  Ht 5' 3.5" (1.613 m)  Wt 129 lb 4.8 oz (58.65 kg)  BMI 22.54 kg/m2   General appearance: Alert, cooperative, well nourished, moderate distress Head: Normocephalic, without obvious abnormality, atraumatic Eyes: Conjunctivae/corneas clear, PERRLA, EOMI Nose: Nares, septum and mucosa are normal, no drainage or sinus tenderness Neck:  No adenopathy, supple, symmetrical, trachea midline, thyroid not enlarged, no tenderness Resp: Clear to auscultation bilaterally Cardio: Regular rate and rhythm, S1, S2 normal, no murmur, click, rub or gallop Breasts: Soft bilaterally, well-healed surgical scars, no lymphadenopathy, left breast nipple retraction, no axilla fullness, benign breast exam GI: Soft, not distended, non-tender, hypoactive bowel sounds, no organomegaly Extremities: Extremities normal, atraumatic, no cyanosis or edema Lymph nodes: Cervical, supraclavicular, and axillary nodes normal Neurologic: Grossly normal    ECOG FS:  Grade 0 - Fully active            LAB RESULTS: Lab Results  Component Value Date   WBC 6.9 11/30/2012   NEUTROABS 4.5 11/30/2012   HGB 14.6 11/30/2012   HCT 43.1 11/30/2012   MCV 90.2 11/30/2012   PLT 152 11/30/2012      Chemistry      Component Value Date/Time   NA 143 11/30/2012 0843   NA 142 09/29/2011 0833   K 3.9 11/30/2012 0843   K 4.1 09/29/2011 0833   CL 105 06/06/2012 0828   CL 104 09/29/2011 0833   CO2 24 11/30/2012 0843   CO2 29 09/29/2011 0833   BUN 12.3 11/30/2012 0843   BUN 12 09/29/2011 0833   CREATININE 0.9 11/30/2012 0843   CREATININE 0.82 09/29/2011 0833      Component Value Date/Time   CALCIUM 9.7 11/30/2012 0843   CALCIUM 9.6 09/29/2011 0833   ALKPHOS 65 11/30/2012 0843   ALKPHOS 62 09/29/2011 0833   AST 12 11/30/2012 0843   AST 12 09/29/2011 0833   ALT 9 11/30/2012 0843   ALT 9 09/29/2011 0833   BILITOT 1.64* 11/30/2012 0843   BILITOT 1.0 09/29/2011 0833       Lab Results  Component Value Date   LABCA2 16 10/30/2011    RADIOGRAPHIC STUDIES: No results found.  ASSESSMENT: [name] is a 67 y.o.  woman: She is status post right lumpectomy and axillary lymph node dissection on 08/31/2009 (with concurrent left retroareolar excisional biopsy of an area of benign ductal ectasia) for a T1c N1 invasive ductal carcinoma, Grade 1, with negative margins and no evidence of lymphovascular  invasion. The final pathology revealed a 1.2 cm, grade 1 invasive ductal carcinoma with metastatic carcinoma in 2 of 8 lymph nodes.  ER 97%, PR 81%, Ki-67 10%, HER-2/neu by CISH no amplification was detected, the ratio was 1.29.  She has taken Arimidex since 12/2009 and is tolerating it well.patient has no evidence of recurrent disease.  PLAN: #1 patient will continue Arimidex 1 mg daily. Overall she's tolerating it well.  #2 she will be seen back in 6 months time for followup here  All questions were answered.  The patient was encouraged to contact us in the interim with any problems, questions or concerns.    Drue Second, MD Medical/Oncology [facility] [phone] (beeper) [phone] (Office)  12/18/2012, 11:58 PM

## 2013-04-10 ENCOUNTER — Other Ambulatory Visit: Payer: Self-pay | Admitting: *Deleted

## 2013-04-10 DIAGNOSIS — E559 Vitamin D deficiency, unspecified: Secondary | ICD-10-CM

## 2013-04-10 DIAGNOSIS — C50919 Malignant neoplasm of unspecified site of unspecified female breast: Secondary | ICD-10-CM

## 2013-04-10 MED ORDER — ANASTROZOLE 1 MG PO TABS: 1.0000 mg | ORAL_TABLET | Freq: Every day | ORAL | Status: DC

## 2013-04-10 NOTE — Telephone Encounter (Signed)
Pt called, requesting refill on Arimidex. Pt recently changed Insurance to [place]. New Pharmacy is [facility]. Per Md's last office note, pt to continue Arimidex daily. Next f/u 06/02/13.  Refill sent to [facility], system updated.Pt confirmed appt 06/02/13

## 2013-06-02 ENCOUNTER — Telehealth: Payer: Self-pay | Admitting: *Deleted

## 2013-06-02 ENCOUNTER — Other Ambulatory Visit (HOSPITAL_BASED_OUTPATIENT_CLINIC_OR_DEPARTMENT_OTHER): Payer: Medicare HMO

## 2013-06-02 ENCOUNTER — Ambulatory Visit (HOSPITAL_BASED_OUTPATIENT_CLINIC_OR_DEPARTMENT_OTHER): Payer: Commercial Managed Care - HMO | Admitting: Oncology

## 2013-06-02 ENCOUNTER — Encounter: Payer: Self-pay | Admitting: Oncology

## 2013-06-02 VITALS — BP 104/64 | HR 87 | Temp 98.3°F | Resp 18 | Wt 127.5 lb

## 2013-06-02 DIAGNOSIS — Z170 Estrogen receptor positive status [ER+]: Secondary | ICD-10-CM

## 2013-06-02 DIAGNOSIS — C50919 Malignant neoplasm of unspecified site of unspecified female breast: Secondary | ICD-10-CM

## 2013-06-02 DIAGNOSIS — E559 Vitamin D deficiency, unspecified: Secondary | ICD-10-CM

## 2013-06-02 DIAGNOSIS — C50619 Malignant neoplasm of axillary tail of unspecified female breast: Secondary | ICD-10-CM

## 2013-06-02 LAB — COMPREHENSIVE METABOLIC PANEL (CC13)
ALBUMIN: 4 g/dL (ref 3.5–5.0)
ALK PHOS: 70 U/L (ref 40–150)
ALT: 11 U/L (ref 0–55)
AST: 14 U/L (ref 5–34)
Anion Gap: 10 mEq/L (ref 3–11)
BUN: 10.1 mg/dL (ref 7.0–26.0)
CO2: 26 mEq/L (ref 22–29)
Calcium: 9.8 mg/dL (ref 8.4–10.4)
Chloride: 102 mEq/L (ref 98–109)
Creatinine: 0.8 mg/dL (ref 0.6–1.1)
Glucose: 78 mg/dl (ref 70–140)
POTASSIUM: 4 meq/L (ref 3.5–5.1)
SODIUM: 138 meq/L (ref 136–145)
Total Bilirubin: 1.18 mg/dL (ref 0.20–1.20)
Total Protein: 6.3 g/dL — ABNORMAL LOW (ref 6.4–8.3)

## 2013-06-02 LAB — CBC WITH DIFFERENTIAL/PLATELET
BASO%: 0.9 % (ref 0.0–2.0)
Basophils Absolute: 0 10*3/uL (ref 0.0–0.1)
EOS%: 3.6 % (ref 0.0–7.0)
Eosinophils Absolute: 0.2 10*3/uL (ref 0.0–0.5)
HCT: 43.4 % (ref 34.8–46.6)
HGB: 14.5 g/dL (ref 11.6–15.9)
LYMPH%: 33.5 % (ref 14.0–49.7)
MCH: 29.8 pg (ref 25.1–34.0)
MCHC: 33.4 g/dL (ref 31.5–36.0)
MCV: 89.2 fL (ref 79.5–101.0)
MONO#: 0.3 10*3/uL (ref 0.1–0.9)
MONO%: 6.9 % (ref 0.0–14.0)
NEUT#: 2.7 10*3/uL (ref 1.5–6.5)
NEUT%: 55.1 % (ref 38.4–76.8)
Platelets: 150 10*3/uL (ref 145–400)
RBC: 4.86 10*6/uL (ref 3.70–5.45)
RDW: 12.8 % (ref 11.2–14.5)
WBC: 4.9 10*3/uL (ref 3.9–10.3)
lymph#: 1.6 10*3/uL (ref 0.9–3.3)

## 2013-06-02 MED ORDER — ANASTROZOLE 1 MG PO TABS: 1.0000 mg | ORAL_TABLET | Freq: Every day | ORAL | Status: DC

## 2013-06-02 NOTE — Telephone Encounter (Signed)
appts made and printed...td 

## 2013-06-02 NOTE — Progress Notes (Signed)
[facility]  Telephone:(336) [phone] Fax:(336) [phone]  OFFICE PROGRESS NOTE  PATIENT: Sarah Lester   DOB: 1945/12/04  MR#: [id number]  KWI#:[id number]  ME:QAST,MHDQQIW, MD  DIAGNOSIS: Sarah Lester is a 68 year-old stage II right breast cancer diagnosed May 2011.   PRIOR THERAPY: 1.  status post right lumpectomy and axillary lymph node dissection on 08/31/2009 (with concurrent left retroareolar excisional biopsy of an area of benign ductal ectasia) for a T1c N1 invasive ductal carcinoma, Grade 1, with negative margins and no evidence of lymphovascular invasion. The final pathology revealed a 1.2 cm, grade 1 invasive ductal carcinoma with metastatic carcinoma in 2 of 8 lymph nodes.  ER 97%, PR 81%, Ki-67 10%, HER-2/neu by CISH no amplification was detected, the ratio was 1.29.   2. The patient had Oncotype DX sent on the tumor specimen. The Oncotype DX score was 13 with a 5 year risk of recurrence of 8%.  The patient did not require chemotherapy based on Oncotype results.  3. The patient is status post radiation therapy from 11/14/2009 through 12/25/2009.  4. The patient began Arimidex 1 mg by mouth daily in 12/2009.    CURRENT THERAPY:    Arimidex 1 mg by mouth daily.    INTERVAL HISTORY:  Sarah Lester is a 68 year-old [place] woman today for follow up for invasive ductal carcinoma..Since her last office visit the patient denies any symptomatology including no hospitalizations, ER visits, breast changes, shortness of breath, nausea/vomiting/diarrhea/constipation, fever, chills, night sweats, hot flashes or vaginal dryness. She continues to work full time at an [employer] in [place], [place].  She is very physically active and likes to do Yoga.    PAST MEDICAL HISTORY: Past Medical History  Diagnosis Date  . Cancer 2011     Right axillary    PAST SURGICAL HISTORY: Past Surgical History  Procedure Laterality Date  . Axillary surgery   2011    cancer/radiation Tx  . Cesarean section  1978    GYNECOLOGIC HISTORY:  Gravida 4, part 3, menarche age 26. She has previously used hormone replacement therapy.    FAMILY HISTORY: Family History  Problem Relation Age of Onset  . Colon cancer Neg Hx   . Esophageal cancer Neg Hx   . Rectal cancer Neg Hx   . Stomach cancer Neg Hx   . Alzheimer's disease Mother     SOCIAL HISTORY: History  Substance Use Topics  . Smoking status: Former Research scientist (life sciences)  . Smokeless tobacco: Never Used  . Alcohol Use: 8.4 oz/week    14 Glasses of wine per week    ALLERGIES: No Known Allergies   MEDICATIONS:  Current Outpatient Prescriptions  Medication Sig Dispense Refill  . anastrozole (ARIMIDEX) 1 MG tablet Take 1 tablet (1 mg total) by mouth daily.  90 tablet  0  . cholecalciferol (VITAMIN D) 1000 UNITS tablet Take 1,000 Units by mouth daily.        Marland Kitchen OVER THE COUNTER MEDICATION Take 1,000 mg by mouth daily. Coral Calcium wit D3 and magnesium       No current facility-administered medications for this visit.      REVIEW OF SYSTEMS: A 10 point review of systems was completed and is negative.    PHYSICAL EXAMINATION: BP 104/64  Pulse 87  Temp(Src) 98.3 F (36.8 C) (Oral)  Resp 18  Wt 127 lb 8 oz (57.834 kg)   General appearance: Alert, cooperative, well nourished, moderate distress Head: Normocephalic, without obvious abnormality, atraumatic  Eyes: Conjunctivae/corneas clear, PERRLA, EOMI Nose: Nares, septum and mucosa are normal, no drainage or sinus tenderness Neck: No adenopathy, supple, symmetrical, trachea midline, thyroid not enlarged, no tenderness Resp: Clear to auscultation bilaterally Cardio: Regular rate and rhythm, S1, S2 normal, no murmur, click, rub or gallop Breasts: Soft bilaterally, well-healed surgical scars, no lymphadenopathy, left breast nipple retraction, no axilla fullness, benign breast exam GI: Soft, not distended, non-tender, hypoactive bowel sounds, no  organomegaly Extremities: Extremities normal, atraumatic, no cyanosis or edema Lymph nodes: Cervical, supraclavicular, and axillary nodes normal Neurologic: Grossly normal    ECOG FS:  Grade 0 - Fully active            LAB RESULTS: Lab Results  Component Value Date   WBC 4.9 06/02/2013   NEUTROABS 2.7 06/02/2013   HGB 14.5 06/02/2013   HCT 43.4 06/02/2013   MCV 89.2 06/02/2013   PLT 150 06/02/2013      Chemistry      Component Value Date/Time   NA 138 06/02/2013 0849   NA 142 09/29/2011 0833   K 4.0 06/02/2013 0849   K 4.1 09/29/2011 0833   CL 105 06/06/2012 0828   CL 104 09/29/2011 0833   CO2 26 06/02/2013 0849   CO2 29 09/29/2011 0833   BUN 10.1 06/02/2013 0849   BUN 12 09/29/2011 0833   CREATININE 0.8 06/02/2013 0849   CREATININE 0.82 09/29/2011 0833      Component Value Date/Time   CALCIUM 9.8 06/02/2013 0849   CALCIUM 9.6 09/29/2011 0833   ALKPHOS 70 06/02/2013 0849   ALKPHOS 62 09/29/2011 0833   AST 14 06/02/2013 0849   AST 12 09/29/2011 0833   ALT 11 06/02/2013 0849   ALT 9 09/29/2011 0833   BILITOT 1.18 06/02/2013 0849   BILITOT 1.0 09/29/2011 0833       Lab Results  Component Value Date   LABCA2 16 10/30/2011    RADIOGRAPHIC STUDIES: No results found.  ASSESSMENT: Sarah Lester is a 69 y.o. [place] woman: She is status post right lumpectomy and axillary lymph node dissection on 08/31/2009 (with concurrent left retroareolar excisional biopsy of an area of benign ductal ectasia) for a T1c N1 invasive ductal carcinoma, Grade 1, with negative margins and no evidence of lymphovascular invasion. The final pathology revealed a 1.2 cm, grade 1 invasive ductal carcinoma with metastatic carcinoma in 2 of 8 lymph nodes.  ER 97%, PR 81%, Ki-67 10%, HER-2/neu by CISH no amplification was detected, the ratio was 1.29.  She has taken Arimidex since 12/2009 and is tolerating it well.patient has no evidence of recurrent disease.  PLAN: #1 patient will continue Arimidex 1 mg daily. Overall she's tolerating it  well.  #2 she will be seen back in 6 months time for followup here  All questions were answered.  The patient was encouraged to contact us in the interim with any problems, questions or concerns.    Marcy Panning, MD Medical/Oncology [facility] [phone] (beeper) [phone] (Office)  06/02/2013, 9:33 AM

## 2013-07-27 ENCOUNTER — Other Ambulatory Visit: Payer: Self-pay | Admitting: Orthopedic Surgery

## 2013-08-02 ENCOUNTER — Encounter (HOSPITAL_COMMUNITY): Payer: Self-pay | Admitting: Pharmacy Technician

## 2013-08-07 NOTE — Pre-Procedure Instructions (Signed)
Sarah Lester  08/07/2013   Your procedure is scheduled on:  Fri, April 24 @ 12:15 PM  Report to [facility]  at 10:15 AM.  Call this number if you have problems the morning of surgery: [phone]   Remember:   Do not eat food or drink liquids after midnight.   Take these medicines the morning of surgery with A SIP OF WATER: Arimidex(Anastrozole)              No Goody's,BC's,Aleve,Ibuprofen,Aspirin,Fish Oil,or any Herbal Medications   Do not wear jewelry, make-up or nail polish.  Do not wear lotions, powders, or perfumes. You may wear deodorant.  Do not shave 48 hours prior to surgery.   Do not bring valuables to the hospital.  [facility] is not responsible                  for any belongings or valuables.               Contacts, dentures or bridgework may not be worn into surgery.  Leave suitcase in the car. After surgery it may be brought to your room.  For patients admitted to the hospital, discharge time is determined by your                treatment team.              Special Instructions:  [facility] - Preparing for Surgery  Before surgery, you can play an important role.  Because skin is not sterile, your skin needs to be as free of germs as possible.  You can reduce the number of germs on you skin by washing with CHG (chlorahexidine gluconate) soap before surgery.  CHG is an antiseptic cleaner which kills germs and bonds with the skin to continue killing germs even after washing.  Please DO NOT use if you have an allergy to CHG or antibacterial soaps.  If your skin becomes reddened/irritated stop using the CHG and inform your nurse when you arrive at Short Stay.  Do not shave (including legs and underarms) for at least 48 hours prior to the first CHG shower.  You may shave your face.  Please follow these instructions carefully:   1.  Shower with CHG Soap the night before surgery and the                                morning of Surgery.  2.  If you choose  to wash your hair, wash your hair first as usual with your       normal shampoo.  3.  After you shampoo, rinse your hair and body thoroughly to remove the                      Shampoo.  4.  Use CHG as you would any other liquid soap.  You can apply chg directly       to the skin and wash gently with scrungie or a clean washcloth.  5.  Apply the CHG Soap to your body ONLY FROM THE NECK DOWN.        Do not use on open wounds or open sores.  Avoid contact with your eyes,       ears, mouth and genitals (private parts).  Wash genitals (private parts)       with your normal soap.  6.  Wash thoroughly,  paying special attention to the area where your surgery        will be performed.  7.  Thoroughly rinse your body with warm water from the neck down.  8.  DO NOT shower/wash with your normal soap after using and rinsing off       the CHG Soap.  9.  Pat yourself dry with a clean towel.            10.  Wear clean pajamas.            11.  Place clean sheets on your bed the night of your first shower and do not        sleep with pets.  Day of Surgery  Do not apply any lotions/deoderants the morning of surgery.  Please wear clean clothes to the hospital/surgery center.     Please read over the following fact sheets that you were given: Pain Booklet, Coughing and Deep Breathing, Blood Transfusion Information, MRSA Information and Surgical Site Infection Prevention

## 2013-08-08 ENCOUNTER — Encounter (HOSPITAL_COMMUNITY)
Admission: RE | Admit: 2013-08-08 | Discharge: 2013-08-08 | Disposition: A | Discharge status: Home / Self Care | Payer: Medicare HMO | Source: Ambulatory Visit | Attending: Orthopedic Surgery | Admitting: Orthopedic Surgery

## 2013-08-08 ENCOUNTER — Ambulatory Visit (HOSPITAL_COMMUNITY)
Admission: RE | Admit: 2013-08-08 | Discharge: 2013-08-08 | Disposition: A | Discharge status: Home / Self Care | Payer: Medicare HMO | Source: Ambulatory Visit | Attending: Orthopedic Surgery | Admitting: Orthopedic Surgery

## 2013-08-08 ENCOUNTER — Encounter (HOSPITAL_COMMUNITY): Payer: Self-pay

## 2013-08-08 DIAGNOSIS — Z853 Personal history of malignant neoplasm of breast: Secondary | ICD-10-CM | POA: Diagnosis not present

## 2013-08-08 DIAGNOSIS — M1610 Unilateral primary osteoarthritis, unspecified hip: Secondary | ICD-10-CM | POA: Insufficient documentation

## 2013-08-08 DIAGNOSIS — Z87891 Personal history of nicotine dependence: Secondary | ICD-10-CM | POA: Diagnosis not present

## 2013-08-08 DIAGNOSIS — Z01818 Encounter for other preprocedural examination: Secondary | ICD-10-CM | POA: Diagnosis not present

## 2013-08-08 DIAGNOSIS — M169 Osteoarthritis of hip, unspecified: Secondary | ICD-10-CM | POA: Insufficient documentation

## 2013-08-08 HISTORY — DX: Other complications of anesthesia, initial encounter: T88.59XA

## 2013-08-08 HISTORY — DX: Adverse effect of unspecified anesthetic, initial encounter: T41.45XA

## 2013-08-08 HISTORY — DX: Unspecified osteoarthritis, unspecified site: M19.90

## 2013-08-08 HISTORY — DX: Other specified postprocedural states: Z98.890

## 2013-08-08 HISTORY — DX: Other specified postprocedural states: R11.2

## 2013-08-08 LAB — CBC
HEMATOCRIT: 44.5 % (ref 36.0–46.0)
Hemoglobin: 15 g/dL (ref 12.0–15.0)
MCH: 30.2 pg (ref 26.0–34.0)
MCHC: 33.7 g/dL (ref 30.0–36.0)
MCV: 89.5 fL (ref 78.0–100.0)
Platelets: 163 10*3/uL (ref 150–400)
RBC: 4.97 MIL/uL (ref 3.87–5.11)
RDW: 12.6 % (ref 11.5–15.5)
WBC: 6.3 10*3/uL (ref 4.0–10.5)

## 2013-08-08 LAB — COMPREHENSIVE METABOLIC PANEL
ALBUMIN: 4 g/dL (ref 3.5–5.2)
ALT: 11 U/L (ref 0–35)
AST: 16 U/L (ref 0–37)
Alkaline Phosphatase: 74 U/L (ref 39–117)
BUN: 10 mg/dL (ref 6–23)
CO2: 28 mEq/L (ref 19–32)
Calcium: 9.8 mg/dL (ref 8.4–10.5)
Chloride: 100 mEq/L (ref 96–112)
Creatinine, Ser: 0.75 mg/dL (ref 0.50–1.10)
GFR calc Af Amer: >90 mL/min (ref >90)
GFR calc non Af Amer: 85 mL/min — ABNORMAL LOW (ref >90)
Glucose, Bld: 69 mg/dL — ABNORMAL LOW (ref 70–99)
Potassium: 4.2 mEq/L (ref 3.7–5.3)
Sodium: 140 mEq/L (ref 137–147)
TOTAL PROTEIN: 6.9 g/dL (ref 6.0–8.3)
Total Bilirubin: 1 mg/dL (ref 0.3–1.2)

## 2013-08-08 LAB — TYPE AND SCREEN
ABO/RH(D): B POS
Antibody Screen: NEGATIVE

## 2013-08-08 LAB — URINALYSIS, ROUTINE W REFLEX MICROSCOPIC
BILIRUBIN URINE: NEGATIVE
Glucose, UA: NEGATIVE mg/dL
Hgb urine dipstick: NEGATIVE
KETONES UR: NEGATIVE mg/dL
Leukocytes, UA: NEGATIVE
Nitrite: NEGATIVE
PH: 7 (ref 5.0–8.0)
Protein, ur: NEGATIVE mg/dL
Specific Gravity, Urine: 1.008 (ref 1.005–1.030)
UROBILINOGEN UA: 0.2 mg/dL (ref 0.0–1.0)

## 2013-08-08 LAB — ABO/RH: ABO/RH(D): B POS

## 2013-08-08 LAB — SURGICAL PCR SCREEN
MRSA, PCR: NEGATIVE
Staphylococcus aureus: NEGATIVE

## 2013-08-08 LAB — PROTIME-INR
INR: 1.01 (ref 0.00–1.49)
Prothrombin Time: 13.1 seconds (ref 11.6–15.2)

## 2013-08-08 LAB — APTT: aPTT: 29 seconds (ref 24–37)

## 2013-08-17 ENCOUNTER — Other Ambulatory Visit: Payer: Self-pay | Admitting: Orthopedic Surgery

## 2013-08-17 MED ORDER — ACETAMINOPHEN 10 MG/ML IV SOLN
1000.0000 mg | Freq: Once | INTRAVENOUS | Status: AC
  Administered 2013-08-18: 1000 mg via INTRAVENOUS
  Filled 2013-08-17: qty 100

## 2013-08-17 MED ORDER — BUPIVACAINE LIPOSOME 1.3 % IJ SUSP
20.0000 mL | INTRAMUSCULAR | Status: DC
  Filled 2013-08-17: qty 20

## 2013-08-17 MED ORDER — CEFAZOLIN SODIUM-DEXTROSE 2-3 GM-% IV SOLR
2.0000 g | INTRAVENOUS | Status: AC
  Administered 2013-08-18: 2 g via INTRAVENOUS
  Filled 2013-08-17: qty 50

## 2013-08-17 MED ORDER — DEXAMETHASONE SODIUM PHOSPHATE 10 MG/ML IJ SOLN
10.0000 mg | INTRAMUSCULAR | Status: AC
  Administered 2013-08-18: 10 mg via INTRAVENOUS
  Filled 2013-08-17: qty 1

## 2013-08-17 NOTE — H&P (Signed)
Sarah Lester DOB: 1945-11-09 Widowed / Language: English / Race: White Female  Date of Admission:  08-18-2013  Chief Complaint:  Left Hip Pain  History of Present Illness The patient is a 68 year old female who comes in for a preoperative History and Physical. The patient is scheduled for a left total hip arthroplasty (anterior approach) to be performed by Dr. Dione Plover. Aluisio, MD at [facility] on 08/18/13. The patient is a 68 year old female who presents with a hip problem. The patient was seen for a second opinion.The patient reports left lateral hip problems including pain symptoms that have been present for 10 month(s). Symptoms reported include hip pain, night pain, difficulty rotating hip and difficulty ambulating The patient reports symptoms radiating to the: left thigh anteriorly and left calf. The patient describes the hip problem as aching.The patient feels as if their symptoms are does feel they are worsening. Current treatment includes restricted activity and nonsteroidal anti-inflammatory drugs. Prior to being seen today the patient was previously evaluated by a colleague (Dr. Marlou Sa). She reports that she has been having pain in along the lateral aspect of her left hip. She does at times get some pain radiating down the anterior thigh and occasionally to the foot. She denies numbness and tingling in the legs. She has been treated for bursitis in the past, even having a cortisone injection in April. This has not relieved her symptoms. She has not had an intra-articular injection. She is very discouraged as she has had to make significant alterations in her life. She used to walk 2 miles per day as well as swim. Her hip pain has made it almost impossible to do these activities. She is ready to proceed with the hip surgery. They have been treated conservatively in the past for the above stated problem and despite conservative measures, they continue to have progressive  pain and severe functional limitations and dysfunction. They have failed non-operative management including home exercise, medications. It is felt that they would benefit from undergoing total joint replacement. Risks and benefits of the procedure have been discussed with the patient and they elect to proceed with surgery. There are no active contraindications to surgery such as ongoing infection or rapidly progressive neurological disease.   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 live alone Marital status. widowed Exercise. Exercises weekly; does running / walking and other Illicit drug use. no Number of flights of stairs before winded. greater than 5 Pain Contract. no Tobacco use. never smoker Drug/Alcohol Rehab (Previously). no Current work status. working part time Drug/Alcohol Rehab (Currently). no Alcohol use. current drinker; drinks wine; 5-7 per week Children. 2 Advance Directives. Living Will, Healthcare POA Post-Surgical Plans. REHAB    Medication History Anastrozole (1MG  Tablet, Oral) Active. Vitamin D ( Oral) Specific dose unknown - Active. Calcium Carbonate ( Oral) Specific dose unknown - Active. Medications Reconciled.   Past Surgical History Breast Mass; Local Excision. right Cesarean Delivery. 1 time   Review of Systems General:Not Present- Chills, Fever, Night Sweats, Fatigue, Weight Gain, Weight Loss and Memory Loss. Skin:Not Present- Hives, Itching, Rash, Eczema and Lesions. HEENT:Not Present- Tinnitus, Headache, Double Vision, Visual Loss, Hearing Loss and Dentures. Respiratory:Not Present- Shortness of breath with exertion, Shortness of breath at rest, Allergies, Coughing up blood and Chronic Cough. Cardiovascular:Not Present- Chest Pain, Racing/skipping  heartbeats, Difficulty Breathing  Lying Down, Murmur, Swelling and Palpitations. Gastrointestinal:Not Present- Bloody Stool, Heartburn, Abdominal Pain, Vomiting, Nausea, Constipation, Diarrhea, Difficulty Swallowing, Jaundice and Loss of appetitie. Female Genitourinary:Not Present- Blood in Urine, Urinary frequency, Weak urinary stream, Discharge, Flank Pain, Incontinence, Painful Urination, Urgency, Urinary Retention and Urinating at Night. Musculoskeletal:Not Present- Muscle Weakness, Muscle Pain, Joint Swelling, Joint Pain, Back Pain, Morning Stiffness and Spasms. Neurological:Not Present- Tremor, Dizziness, Blackout spells, Paralysis, Difficulty with balance and Weakness. Psychiatric:Not Present- Insomnia.    Vitals Weight: 130 lb Height: 64 in Weight was reported by patient. Height was reported by patient. Body Surface Area: 1.63 m Body Mass Index: 22.31 kg/m Pulse: 64 (Regular) Resp.: 14 (Unlabored) BP: 114/62 (Sitting, Left Arm, Standard)     Physical Exam The physical exam findings are as follows:   General Mental Status - Alert, cooperative and good historian. General Appearance- pleasant. Not in acute distress. Orientation- Oriented X3. Build & Nutrition- Well nourished and Well developed.   Head and Neck Head- normocephalic, atraumatic . Neck Global Assessment- supple. no bruit auscultated on the right and no bruit auscultated on the left.   Eye Vision- Wears corrective lenses. Pupil- Bilateral- Regular and Round. Motion- Bilateral- EOMI.   Chest and Lung Exam Auscultation: Breath sounds:- clear at anterior chest wall and - clear at posterior chest wall. Adventitious sounds:- No Adventitious sounds.   Cardiovascular Auscultation:Rhythm- Regular rate and rhythm. Heart Sounds- S1 WNL and S2 WNL. Murmurs & Other Heart Sounds:Auscultation of the heart reveals - No  Murmurs.   Abdomen Palpation/Percussion:Tenderness- Abdomen is non-tender to palpation. Rigidity (guarding)- Abdomen is soft. Auscultation:Auscultation of the abdomen reveals - Bowel sounds normal.   Female Genitourinary   Note: Not done, not pertinent to present illness  Musculoskeletal   Note: She is a well developed female. Alert and oriented. No apparent distress. The right hip shows a normal range of motion without discomfort.  The left hip can be flexed to 90 with essentially no internal rotation and about 10-15 degrees of external rotation and 20 degrees of abduction. The knee exam is normal bilaterally. Pulse, sensation and motor are intact in both lower extremities. She has a significantly antalgic gait pattern on the left.  RADIOGRAPHS: AP pelvis and lateral of the left hip from June shows she has a significant arthritic change in that hip, just about bone on bone. She had new X-rays today, AP pelvis and lateral of her hip and the joint is essentially gone now. She is completely bone on bone with no evidence of any erosion of the femoral head at this time but significant subchondral cystic changes.   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 Wynelle Link.  Plan is to go to rehab.  PCP - Dr. Tommy Medal - At the time of the H&P, the patient states that she has an appointment with Dr. Minna Antis scheduled.  The patient has contraindications and will not receive TXA (tranexamic acid) prior to surgery - [facility].  Signed electronically by Joelene Millin, III PA-C

## 2013-08-18 ENCOUNTER — Inpatient Hospital Stay (HOSPITAL_COMMUNITY)
Admission: RE | Admit: 2013-08-18 | Discharge: 2013-08-21 | DRG: 470 | Disposition: A | Discharge status: Skilled Nursing Facility | Payer: Medicare HMO | Source: Ambulatory Visit | Attending: Orthopedic Surgery | Admitting: Orthopedic Surgery

## 2013-08-18 ENCOUNTER — Inpatient Hospital Stay (HOSPITAL_COMMUNITY): Payer: Medicare HMO | Admitting: Anesthesiology

## 2013-08-18 ENCOUNTER — Encounter (HOSPITAL_COMMUNITY): Payer: Medicare HMO | Admitting: Anesthesiology

## 2013-08-18 ENCOUNTER — Encounter (HOSPITAL_COMMUNITY): Payer: Self-pay | Admitting: *Deleted

## 2013-08-18 ENCOUNTER — Inpatient Hospital Stay (HOSPITAL_COMMUNITY): Payer: Medicare HMO

## 2013-08-18 ENCOUNTER — Encounter (HOSPITAL_COMMUNITY)
Admission: RE | Disposition: A | Discharge status: Skilled Nursing Facility | Payer: Self-pay | Source: Ambulatory Visit | Attending: Orthopedic Surgery

## 2013-08-18 DIAGNOSIS — Z853 Personal history of malignant neoplasm of breast: Secondary | ICD-10-CM

## 2013-08-18 DIAGNOSIS — M1610 Unilateral primary osteoarthritis, unspecified hip: Principal | ICD-10-CM | POA: Diagnosis present

## 2013-08-18 DIAGNOSIS — Z96642 Presence of left artificial hip joint: Secondary | ICD-10-CM

## 2013-08-18 DIAGNOSIS — C50919 Malignant neoplasm of unspecified site of unspecified female breast: Secondary | ICD-10-CM

## 2013-08-18 DIAGNOSIS — M169 Osteoarthritis of hip, unspecified: Principal | ICD-10-CM | POA: Diagnosis present

## 2013-08-18 DIAGNOSIS — Z87891 Personal history of nicotine dependence: Secondary | ICD-10-CM

## 2013-08-18 DIAGNOSIS — Z79899 Other long term (current) drug therapy: Secondary | ICD-10-CM

## 2013-08-18 HISTORY — PX: TOTAL HIP ARTHROPLASTY: SHX124

## 2013-08-18 SURGERY — ARTHROPLASTY, HIP, TOTAL, ANTERIOR APPROACH
Anesthesia: General | Site: Hip | Laterality: Left

## 2013-08-18 MED ORDER — NEOSTIGMINE METHYLSULFATE 1 MG/ML IJ SOLN
INTRAMUSCULAR | Status: DC | PRN
  Administered 2013-08-18: 4 mg via INTRAVENOUS

## 2013-08-18 MED ORDER — GLYCOPYRROLATE 0.2 MG/ML IJ SOLN
INTRAMUSCULAR | Status: DC | PRN
  Administered 2013-08-18: .8 mg via INTRAVENOUS

## 2013-08-18 MED ORDER — RIVAROXABAN 10 MG PO TABS
10.0000 mg | ORAL_TABLET | Freq: Every day | ORAL | Status: DC
  Administered 2013-08-19 – 2013-08-21 (×3): 10 mg via ORAL
  Filled 2013-08-18 (×5): qty 1

## 2013-08-18 MED ORDER — ROCURONIUM BROMIDE 50 MG/5ML IV SOLN
INTRAVENOUS | Status: AC
  Filled 2013-08-18: qty 1

## 2013-08-18 MED ORDER — DOCUSATE SODIUM 100 MG PO CAPS
100.0000 mg | ORAL_CAPSULE | Freq: Two times a day (BID) | ORAL | Status: DC
  Administered 2013-08-18 – 2013-08-21 (×6): 100 mg via ORAL
  Filled 2013-08-18 (×7): qty 1

## 2013-08-18 MED ORDER — ACETAMINOPHEN 650 MG RE SUPP: 650.0000 mg | Freq: Four times a day (QID) | RECTAL | Status: DC | PRN

## 2013-08-18 MED ORDER — MEPERIDINE HCL 25 MG/ML IJ SOLN: 6.2500 mg | INTRAMUSCULAR | Status: DC | PRN

## 2013-08-18 MED ORDER — PHENYLEPHRINE 40 MCG/ML (10ML) SYRINGE FOR IV PUSH (FOR BLOOD PRESSURE SUPPORT)
PREFILLED_SYRINGE | INTRAVENOUS | Status: AC
  Filled 2013-08-18: qty 10

## 2013-08-18 MED ORDER — PROMETHAZINE HCL 25 MG/ML IJ SOLN: 6.2500 mg | INTRAMUSCULAR | Status: DC | PRN

## 2013-08-18 MED ORDER — ACETAMINOPHEN 500 MG PO TABS
1000.0000 mg | ORAL_TABLET | Freq: Four times a day (QID) | ORAL | Status: AC
  Administered 2013-08-18 – 2013-08-19 (×4): 1000 mg via ORAL
  Filled 2013-08-18 (×4): qty 2

## 2013-08-18 MED ORDER — PHENOL 1.4 % MT LIQD: 1.0000 | OROMUCOSAL | Status: DC | PRN

## 2013-08-18 MED ORDER — NEOSTIGMINE METHYLSULFATE 1 MG/ML IJ SOLN
INTRAMUSCULAR | Status: AC
  Filled 2013-08-18: qty 10

## 2013-08-18 MED ORDER — ONDANSETRON HCL 4 MG/2ML IJ SOLN
INTRAMUSCULAR | Status: AC
  Filled 2013-08-18: qty 2

## 2013-08-18 MED ORDER — ONDANSETRON HCL 4 MG/2ML IJ SOLN
4.0000 mg | Freq: Four times a day (QID) | INTRAMUSCULAR | Status: DC | PRN
  Administered 2013-08-18: 4 mg via INTRAVENOUS
  Filled 2013-08-18: qty 2

## 2013-08-18 MED ORDER — METOCLOPRAMIDE HCL 5 MG/ML IJ SOLN
5.0000 mg | Freq: Three times a day (TID) | INTRAMUSCULAR | Status: DC | PRN
  Administered 2013-08-19: 10 mg via INTRAVENOUS
  Filled 2013-08-18: qty 2

## 2013-08-18 MED ORDER — BUPIVACAINE HCL (PF) 0.25 % IJ SOLN
INTRAMUSCULAR | Status: DC | PRN
  Administered 2013-08-18: 20 mL

## 2013-08-18 MED ORDER — ARTIFICIAL TEARS OP OINT
TOPICAL_OINTMENT | OPHTHALMIC | Status: DC | PRN
  Administered 2013-08-18: 1 via OPHTHALMIC

## 2013-08-18 MED ORDER — METHOCARBAMOL 100 MG/ML IJ SOLN
500.0000 mg | Freq: Four times a day (QID) | INTRAVENOUS | Status: DC | PRN
  Filled 2013-08-18: qty 5

## 2013-08-18 MED ORDER — ANASTROZOLE 1 MG PO TABS
1.0000 mg | ORAL_TABLET | Freq: Every day | ORAL | Status: DC
  Administered 2013-08-18 – 2013-08-21 (×4): 1 mg via ORAL
  Filled 2013-08-18 (×4): qty 1

## 2013-08-18 MED ORDER — ROCURONIUM BROMIDE 100 MG/10ML IV SOLN
INTRAVENOUS | Status: DC | PRN
  Administered 2013-08-18: 50 mg via INTRAVENOUS

## 2013-08-18 MED ORDER — MORPHINE SULFATE 2 MG/ML IJ SOLN: 1.0000 mg | INTRAMUSCULAR | Status: DC | PRN

## 2013-08-18 MED ORDER — FENTANYL CITRATE 0.05 MG/ML IJ SOLN
25.0000 ug | INTRAMUSCULAR | Status: DC | PRN
  Administered 2013-08-18 (×2): 50 ug via INTRAVENOUS

## 2013-08-18 MED ORDER — MENTHOL 3 MG MT LOZG: 1.0000 | LOZENGE | OROMUCOSAL | Status: DC | PRN

## 2013-08-18 MED ORDER — POLYETHYLENE GLYCOL 3350 17 G PO PACK: 17.0000 g | PACK | Freq: Every day | ORAL | Status: DC | PRN

## 2013-08-18 MED ORDER — FENTANYL CITRATE 0.05 MG/ML IJ SOLN
INTRAMUSCULAR | Status: DC | PRN
  Administered 2013-08-18: 50 ug via INTRAVENOUS
  Administered 2013-08-18 (×2): 100 ug via INTRAVENOUS

## 2013-08-18 MED ORDER — BISACODYL 10 MG RE SUPP: 10.0000 mg | Freq: Every day | RECTAL | Status: DC | PRN

## 2013-08-18 MED ORDER — ACETAMINOPHEN 325 MG PO TABS
650.0000 mg | ORAL_TABLET | Freq: Four times a day (QID) | ORAL | Status: DC | PRN
  Administered 2013-08-21: 650 mg via ORAL
  Filled 2013-08-18: qty 2

## 2013-08-18 MED ORDER — CHLORHEXIDINE GLUCONATE 4 % EX LIQD
60.0000 mL | Freq: Once | CUTANEOUS | Status: DC
  Filled 2013-08-18: qty 60

## 2013-08-18 MED ORDER — DIPHENHYDRAMINE HCL 12.5 MG/5ML PO ELIX: 12.5000 mg | ORAL_SOLUTION | ORAL | Status: DC | PRN

## 2013-08-18 MED ORDER — BUPIVACAINE LIPOSOME 1.3 % IJ SUSP
20.0000 mL | INTRAMUSCULAR | Status: DC
  Filled 2013-08-18: qty 20

## 2013-08-18 MED ORDER — PHENYLEPHRINE HCL 10 MG/ML IJ SOLN
INTRAMUSCULAR | Status: DC | PRN
  Administered 2013-08-18: 80 ug via INTRAVENOUS
  Administered 2013-08-18: 120 ug via INTRAVENOUS
  Administered 2013-08-18 (×2): 80 ug via INTRAVENOUS
  Administered 2013-08-18: 40 ug via INTRAVENOUS
  Administered 2013-08-18: 120 ug via INTRAVENOUS

## 2013-08-18 MED ORDER — FENTANYL CITRATE 0.05 MG/ML IJ SOLN
INTRAMUSCULAR | Status: AC
  Filled 2013-08-18: qty 2

## 2013-08-18 MED ORDER — KETOROLAC TROMETHAMINE 15 MG/ML IJ SOLN: 7.5000 mg | Freq: Four times a day (QID) | INTRAMUSCULAR | Status: AC | PRN

## 2013-08-18 MED ORDER — GLYCOPYRROLATE 0.2 MG/ML IJ SOLN
INTRAMUSCULAR | Status: AC
  Filled 2013-08-18: qty 1

## 2013-08-18 MED ORDER — CEFAZOLIN SODIUM 1-5 GM-% IV SOLN
1.0000 g | Freq: Four times a day (QID) | INTRAVENOUS | Status: AC
  Administered 2013-08-18 – 2013-08-19 (×2): 1 g via INTRAVENOUS
  Filled 2013-08-18 (×2): qty 50

## 2013-08-18 MED ORDER — METHOCARBAMOL 500 MG PO TABS
500.0000 mg | ORAL_TABLET | Freq: Four times a day (QID) | ORAL | Status: DC | PRN
  Administered 2013-08-19 – 2013-08-20 (×3): 500 mg via ORAL
  Filled 2013-08-18 (×3): qty 1

## 2013-08-18 MED ORDER — BUPIVACAINE LIPOSOME 1.3 % IJ SUSP
INTRAMUSCULAR | Status: DC | PRN
  Administered 2013-08-18: 20 mL

## 2013-08-18 MED ORDER — FENTANYL CITRATE 0.05 MG/ML IJ SOLN
INTRAMUSCULAR | Status: AC
  Filled 2013-08-18: qty 5

## 2013-08-18 MED ORDER — MIDAZOLAM HCL 2 MG/2ML IJ SOLN
INTRAMUSCULAR | Status: AC
  Filled 2013-08-18: qty 2

## 2013-08-18 MED ORDER — FLEET ENEMA 7-19 GM/118ML RE ENEM: 1.0000 | ENEMA | Freq: Once | RECTAL | Status: AC | PRN

## 2013-08-18 MED ORDER — PROPOFOL 10 MG/ML IV BOLUS
INTRAVENOUS | Status: DC | PRN
  Administered 2013-08-18: 150 mg via INTRAVENOUS

## 2013-08-18 MED ORDER — LIDOCAINE HCL (CARDIAC) 20 MG/ML IV SOLN
INTRAVENOUS | Status: DC | PRN
  Administered 2013-08-18: 80 mg via INTRAVENOUS

## 2013-08-18 MED ORDER — METOCLOPRAMIDE HCL 10 MG PO TABS: 5.0000 mg | ORAL_TABLET | Freq: Three times a day (TID) | ORAL | Status: DC | PRN

## 2013-08-18 MED ORDER — GLYCOPYRROLATE 0.2 MG/ML IJ SOLN
INTRAMUSCULAR | Status: AC
  Filled 2013-08-18: qty 4

## 2013-08-18 MED ORDER — DEXAMETHASONE 6 MG PO TABS
10.0000 mg | ORAL_TABLET | Freq: Every day | ORAL | Status: AC
  Administered 2013-08-19: 10 mg via ORAL
  Filled 2013-08-18: qty 1

## 2013-08-18 MED ORDER — SODIUM CHLORIDE 0.9 % IJ SOLN
INTRAMUSCULAR | Status: DC | PRN
  Administered 2013-08-18: 30 mL

## 2013-08-18 MED ORDER — ONDANSETRON HCL 4 MG/2ML IJ SOLN
INTRAMUSCULAR | Status: DC | PRN
  Administered 2013-08-18: 4 mg via INTRAVENOUS

## 2013-08-18 MED ORDER — LIDOCAINE HCL (CARDIAC) 20 MG/ML IV SOLN
INTRAVENOUS | Status: AC
  Filled 2013-08-18: qty 5

## 2013-08-18 MED ORDER — LACTATED RINGERS IV SOLN
INTRAVENOUS | Status: DC | PRN
  Administered 2013-08-18 (×2): via INTRAVENOUS

## 2013-08-18 MED ORDER — 0.9 % SODIUM CHLORIDE (POUR BTL) OPTIME
TOPICAL | Status: DC | PRN
  Administered 2013-08-18: 1000 mL

## 2013-08-18 MED ORDER — MIDAZOLAM HCL 5 MG/5ML IJ SOLN
INTRAMUSCULAR | Status: DC | PRN
  Administered 2013-08-18: 2 mg via INTRAVENOUS

## 2013-08-18 MED ORDER — PROPOFOL 10 MG/ML IV BOLUS
INTRAVENOUS | Status: AC
  Filled 2013-08-18: qty 20

## 2013-08-18 MED ORDER — OXYCODONE HCL 5 MG PO TABS
5.0000 mg | ORAL_TABLET | ORAL | Status: DC | PRN
  Administered 2013-08-18: 10 mg via ORAL
  Administered 2013-08-18 – 2013-08-20 (×3): 5 mg via ORAL
  Administered 2013-08-20: 10 mg via ORAL
  Filled 2013-08-18: qty 2
  Filled 2013-08-18 (×2): qty 1
  Filled 2013-08-18: qty 2
  Filled 2013-08-18: qty 1

## 2013-08-18 MED ORDER — BUPIVACAINE HCL (PF) 0.25 % IJ SOLN
INTRAMUSCULAR | Status: AC
  Filled 2013-08-18: qty 30

## 2013-08-18 MED ORDER — LACTATED RINGERS IV SOLN
INTRAVENOUS | Status: DC
  Administered 2013-08-18: 11:00:00 via INTRAVENOUS

## 2013-08-18 MED ORDER — DEXAMETHASONE SODIUM PHOSPHATE 10 MG/ML IJ SOLN
10.0000 mg | Freq: Every day | INTRAMUSCULAR | Status: AC
  Filled 2013-08-18: qty 1

## 2013-08-18 MED ORDER — ONDANSETRON HCL 4 MG PO TABS: 4.0000 mg | ORAL_TABLET | Freq: Four times a day (QID) | ORAL | Status: DC | PRN

## 2013-08-18 MED ORDER — SODIUM CHLORIDE 0.9 % IV SOLN: INTRAVENOUS | Status: DC

## 2013-08-18 MED ORDER — TRAMADOL HCL 50 MG PO TABS
50.0000 mg | ORAL_TABLET | Freq: Four times a day (QID) | ORAL | Status: DC | PRN
  Administered 2013-08-19: 50 mg via ORAL
  Filled 2013-08-18: qty 1
  Filled 2013-08-18: qty 2

## 2013-08-18 MED ORDER — DEXTROSE-NACL 5-0.9 % IV SOLN
INTRAVENOUS | Status: DC
  Administered 2013-08-18 – 2013-08-19 (×2): via INTRAVENOUS

## 2013-08-18 SURGICAL SUPPLY — 45 items
BLADE SAW SGTL 18X1.27X75 (BLADE) ×2 IMPLANT
BLADE SAW SGTL 18X1.27X75MM (BLADE) ×1
CAPT HIP PF COP ×2 IMPLANT
CLOSURE STERI-STRIP 1/2X4 (GAUZE/BANDAGES/DRESSINGS) ×1
CLSR STERI-STRIP ANTIMIC 1/2X4 (GAUZE/BANDAGES/DRESSINGS) ×3 IMPLANT
DECANTER SPIKE VIAL GLASS SM (MISCELLANEOUS) ×3 IMPLANT
DRAPE C-ARM 42X72 X-RAY (DRAPES) ×3 IMPLANT
DRAPE STERI IOBAN 125X83 (DRAPES) ×3 IMPLANT
DRAPE U-SHAPE 47X51 STRL (DRAPES) ×9 IMPLANT
DRSG ADAPTIC 3X8 NADH LF (GAUZE/BANDAGES/DRESSINGS) ×3 IMPLANT
DRSG MEPILEX BORDER 4X4 (GAUZE/BANDAGES/DRESSINGS) ×2 IMPLANT
DRSG MEPILEX BORDER 4X8 (GAUZE/BANDAGES/DRESSINGS) ×3 IMPLANT
DURAPREP 26ML APPLICATOR (WOUND CARE) ×3 IMPLANT
ELECT BLADE 6.5 EXT (BLADE) ×3 IMPLANT
ELECT REM PT RETURN 9FT ADLT (ELECTROSURGICAL) ×3
ELECTRODE REM PT RTRN 9FT ADLT (ELECTROSURGICAL) ×1 IMPLANT
EVACUATOR 1/8 PVC DRAIN (DRAIN) ×3 IMPLANT
FACESHIELD WRAPAROUND (MASK) ×12 IMPLANT
FACESHIELD WRAPAROUND OR TEAM (MASK) ×4 IMPLANT
GLOVE BIO SURGEON STRL SZ8 (GLOVE) ×6 IMPLANT
GLOVE BIOGEL PI IND STRL 8 (GLOVE) ×1 IMPLANT
GLOVE BIOGEL PI INDICATOR 8 (GLOVE) ×2
GLOVE ECLIPSE 8.0 STRL XLNG CF (GLOVE) ×3 IMPLANT
GOWN STRL REUS W/ TWL LRG LVL3 (GOWN DISPOSABLE) ×1 IMPLANT
GOWN STRL REUS W/ TWL XL LVL3 (GOWN DISPOSABLE) ×1 IMPLANT
GOWN STRL REUS W/TWL LRG LVL3 (GOWN DISPOSABLE) ×3
GOWN STRL REUS W/TWL XL LVL3 (GOWN DISPOSABLE) ×3
KIT BASIN OR (CUSTOM PROCEDURE TRAY) ×3 IMPLANT
NDL 18GX1X1/2 (RX/OR ONLY) (NEEDLE) ×1 IMPLANT
NDL SAFETY ECLIPSE 18X1.5 (NEEDLE) ×1 IMPLANT
NEEDLE 18GX1X1/2 (RX/OR ONLY) (NEEDLE) ×3 IMPLANT
NEEDLE 22X1 1/2 (OR ONLY) (NEEDLE) ×3 IMPLANT
NEEDLE HYPO 18GX1.5 SHARP (NEEDLE) ×3
PACK TOTAL JOINT (CUSTOM PROCEDURE TRAY) ×3 IMPLANT
SUT ETHIBOND NAB CT1 #1 30IN (SUTURE) ×9 IMPLANT
SUT MNCRL AB 4-0 PS2 18 (SUTURE) ×3 IMPLANT
SUT VIC AB 1 CT1 27 (SUTURE) ×3
SUT VIC AB 1 CT1 27XBRD ANTBC (SUTURE) ×1 IMPLANT
SUT VIC AB 2-0 CT1 27 (SUTURE) ×6
SUT VIC AB 2-0 CT1 TAPERPNT 27 (SUTURE) ×2 IMPLANT
SUT VLOC 180 0 24IN GS25 (SUTURE) ×3 IMPLANT
SYR 20CC LL (SYRINGE) ×3 IMPLANT
SYR 50ML LL SCALE MARK (SYRINGE) ×3 IMPLANT
TOWEL OR 17X26 10 PK STRL BLUE (TOWEL DISPOSABLE) ×6 IMPLANT
TRAY FOLEY CATH 14FRSI W/METER (CATHETERS) ×3 IMPLANT

## 2013-08-18 NOTE — Anesthesia Procedure Notes (Signed)
Procedure Name: Intubation Date/Time: 08/18/2013 1:09 PM Performed by: Neldon Newport Pre-anesthesia Checklist: Patient identified, Timeout performed, Emergency Drugs available, Suction available and Patient being monitored Patient Re-evaluated:Patient Re-evaluated prior to inductionOxygen Delivery Method: Circle system utilized Preoxygenation: Pre-oxygenation with 100% oxygen Intubation Type: IV induction Ventilation: Mask ventilation without difficulty Laryngoscope Size: Mac and 3 Grade View: Grade I Tube type: Oral Tube size: 7.0 mm Number of attempts: 1 Placement Confirmation: positive ETCO2,  ETT inserted through vocal cords under direct vision and breath sounds checked- equal and bilateral Secured at: 22 cm Tube secured with: Tape Dental Injury: Teeth and Oropharynx as per pre-operative assessment

## 2013-08-18 NOTE — Anesthesia Postprocedure Evaluation (Signed)
Anesthesia Post Note  Patient: Sarah Lester  Procedure(s) Performed: Procedure(s) (LRB): LEFT TOTAL HIP ARTHROPLASTY ANTERIOR APPROACH (Left)  Anesthesia type: General  Patient location: PACU  Post pain: Pain level controlled and Adequate analgesia  Post assessment: Post-op Vital signs reviewed, Patient's Cardiovascular Status Stable, Respiratory Function Stable, Patent Airway and Pain level controlled  Last Vitals:  Filed Vitals:   08/18/13 1545  BP: 95/49  Pulse: 61  Temp:   Resp: 14    Post vital signs: Reviewed and stable  Level of consciousness: awake, alert  and oriented  Complications: No apparent anesthesia complications

## 2013-08-18 NOTE — Interval H&P Note (Signed)
History and Physical Interval Note:  08/18/2013 11:54 AM  Sarah Lester  has presented today for surgery, with the diagnosis of OA LEFT HIP  The various methods of treatment have been discussed with the patient and family. After consideration of risks, benefits and other options for treatment, the patient has consented to  Procedure(s): LEFT TOTAL HIP ARTHROPLASTY ANTERIOR APPROACH (Left) as a surgical intervention .  The patient's history has been reviewed, patient examined, no change in status, stable for surgery.  I have reviewed the patient's chart and labs.  Questions were answered to the patient's satisfaction.     Dione Plover Aaryan Essman

## 2013-08-18 NOTE — Transfer of Care (Signed)
Immediate Anesthesia Transfer of Care Note  Patient: Sarah Lester  Procedure(s) Performed: Procedure(s): LEFT TOTAL HIP ARTHROPLASTY ANTERIOR APPROACH (Left)  Patient Location: PACU  Anesthesia Type:General  Level of Consciousness: awake, alert  and oriented  Airway & Oxygen Therapy: Patient Spontanous Breathing and Patient connected to nasal cannula oxygen  Post-op Assessment: Report given to PACU RN, Post -op Vital signs reviewed and stable and Patient moving all extremities X 4  Post vital signs: Reviewed and stable  Complications: No apparent anesthesia complications

## 2013-08-18 NOTE — OR Nursing (Signed)
Dr. Marcie Bal informed of BP of 85/34 upon arrival to 5N; instructions recvd to continue close observation, no interventions currently.

## 2013-08-18 NOTE — Anesthesia Preprocedure Evaluation (Addendum)
Anesthesia Evaluation  Patient identified by MRN, date of birth, ID band Patient awake    Reviewed: Allergy & Precautions, H&P , Patient's Chart, lab work & pertinent test results, reviewed documented beta blocker date and time   History of Anesthesia Complications (+) PONV and history of anesthetic complications  Airway Mallampati: II TM Distance: >3 FB Neck ROM: full    Dental  (+) Dental Advidsory Given, Teeth Intact   Pulmonary former smoker,  breath sounds clear to auscultation        Cardiovascular Exercise Tolerance: Good Rhythm:regular Rate:Normal     Neuro/Psych    GI/Hepatic   Endo/Other    Renal/GU      Musculoskeletal   Abdominal   Peds  Hematology   Anesthesia Other Findings   Reproductive/Obstetrics                          Anesthesia Physical Anesthesia Plan  ASA: II  Anesthesia Plan: General ETT   Post-op Pain Management:    Induction:   Airway Management Planned:   Additional Equipment:   Intra-op Plan:   Post-operative Plan:   Informed Consent: I have reviewed the patients History and Physical, chart, labs and discussed the procedure including the risks, benefits and alternatives for the proposed anesthesia with the patient or authorized representative who has indicated his/her understanding and acceptance.   Dental Advisory Given  Plan Discussed with: CRNA and Surgeon  Anesthesia Plan Comments:         Anesthesia Quick Evaluation

## 2013-08-18 NOTE — Plan of Care (Signed)
Problem: Consults Goal: Diagnosis- Total Joint Replacement Primary Total Hip Left     

## 2013-08-18 NOTE — Op Note (Signed)
OPERATIVE REPORT  PREOPERATIVE DIAGNOSIS: Osteoarthritis of the Left hip.   POSTOPERATIVE DIAGNOSIS: Osteoarthritis of the Left  hip.   PROCEDURE: Left total hip arthroplasty, anterior approach.   SURGEON: Gaynelle Arabian, MD   ASSISTANT: Arlee Muslim, PA-C  ANESTHESIA:  General  ESTIMATED BLOOD LOSS:- 200 ml  DRAINS: Hemovac x1.   COMPLICATIONS: None   CONDITION: PACU - hemodynamically stable.   BRIEF CLINICAL NOTE: Sarah Lester is a 68 y.o. female who has advanced end-  stage arthritis of his Left  hip with progressively worsening pain and  dysfunction.The patient has failed nonoperative management and presents for  total hip arthroplasty.   PROCEDURE IN DETAIL: After successful administration of spinal  anesthetic, the traction boots for the [facility] bed were placed on both  feet and the patient was placed onto the [facility] bed, boots placed into the leg  holders. The Left hip was then isolated from the perineum with plastic  drapes and prepped and draped in the usual sterile fashion. ASIS and  greater trochanter were marked and a oblique incision was made, starting  at about 1 cm lateral and 2 cm distal to the ASIS and coursing towards  the anterior cortex of the femur. The skin was cut with a 10 blade  through subcutaneous tissue to the level of the fascia overlying the  tensor fascia lata muscle. The fascia was then incised in line with the  incision at the junction of the anterior third and posterior 2/3rd. The  muscle was teased off the fascia and then the interval between the TFL  and the rectus was developed. The Hohmann retractor was then placed at  the top of the femoral neck over the capsule. The vessels overlying the  capsule were cauterized and the fat on top of the capsule was removed.  A Hohmann retractor was then placed anterior underneath the rectus  femoris to give exposure to the entire anterior capsule. A T-shaped  capsulotomy was performed.  The edges were tagged and the femoral head  was identified.       Osteophytes are removed off the superior acetabulum.  The femoral neck was then cut in situ with an oscillating saw. Traction  was then applied to the left lower extremity utilizing the [facility]  traction. The femoral head was then removed. Retractors were placed  around the acetabulum and then circumferential removal of the labrum was  performed. Osteophytes were also removed. Reaming starts at 45 mm to  medialize and  Increased in 2 mm increments to 49 mm. We reamed in  approximately 40 degrees of abduction, 20 degrees anteversion. A 50 mm  pinnacle acetabular shell was then impacted in anatomic position under  fluoroscopic guidance with excellent purchase. We did not need to place  any additional dome screws. A 32 mm neutral + 4 marathon liner was then  placed into the acetabular shell.       The femoral lift was then placed along the lateral aspect of the femur  just distal to the vastus ridge. The leg was  externally rotated and capsule  was stripped off the inferior aspect of the femoral neck down to the  level of the lesser trochanter, this was done with electrocautery. The femur was lifted after this was performed. The  leg was then placed and extended in adducted position to essentially delivering the femur. We also removed the capsule superiorly and the  piriformis from the piriformis  fossa to gain excellent exposure of the  proximal femur. Rongeur was used to remove some cancellous bone to get  into the lateral portion of the proximal femur for placement of the  initial starter reamer. The starter broaches was placed  the starter broach  and was shown to go down the center of the canal. Broaching  with the  Corail system was then performed starting at size 8, coursing  Up to size 10. A size 10 had excellent torsional and rotational  and axial stability. The trial standard offset neck was then placed  with a 32 + 1  trial head. The hip was then reduced. We confirmed that  the stem was in the canal both on AP and lateral x-rays. It also has excellent sizing. The hip was reduced with outstanding stability through full extension, full external rotation,  and then flexion in adduction internal rotation. AP pelvis was taken  and the leg lengths were measured and found to be exactly equal. Hip  was then dislocated again and the femoral head and neck removed. The  femoral broach was removed. Size 10 Corail stem with a standard offset  neck was then impacted into the femur following native anteversion. Has  excellent purchase in the canal. Excellent torsional and rotational and  axial stability. It is confirmed to be in the canal on AP and lateral  fluoroscopic views. The 32 + 1 ceramic head was placed and the hip  reduced with outstanding stability. Again AP pelvis was taken and it  confirmed that the leg lengths were equal. The wound was then copiously  irrigated with saline solution and the capsule reattached and repaired  with Ethibond suture.  20 mL of Exparel mixed with 50 mL of saline then additional 20 ml of .25% Bupivicaine injected into the capsule and into the edge of the tensor fascia lata as well as subcutaneous tissue. The fascia overlying the tensor fascia lata was  then closed with a running #1 V-Loc. Subcu was closed with interrupted  2-0 Vicryl and subcuticular running 4-0 Monocryl. Incision was cleaned  and dried. Steri-Strips and a bulky sterile dressing applied. Hemovac  drain was hooked to suction and then he was awakened and transported to  recovery in stable condition.        Please note that a surgical assistant was a medical necessity for this procedure to perform it in a safe and expeditious manner. Assistant was necessary to provide appropriate retraction of vital neurovascular structures and to prevent femoral fracture and allow for anatomic placement of the prosthesis.  Gaynelle Arabian,  M.D.

## 2013-08-18 NOTE — H&P (View-Only) (Signed)
Sarah Lester DOB: 1945-11-09 Widowed / Language: English / Race: White Female  Date of Admission:  08-18-2013  Chief Complaint:  Left Hip Pain  History of Present Illness The patient is a 68 year old female who comes in for a preoperative History and Physical. The patient is scheduled for a left total hip arthroplasty (anterior approach) to be performed by Dr. Dione Plover. Aluisio, MD at [facility] on 08/18/13. The patient is a 68 year old female who presents with a hip problem. The patient was seen for a second opinion.The patient reports left lateral hip problems including pain symptoms that have been present for 10 month(s). Symptoms reported include hip pain, night pain, difficulty rotating hip and difficulty ambulating The patient reports symptoms radiating to the: left thigh anteriorly and left calf. The patient describes the hip problem as aching.The patient feels as if their symptoms are does feel they are worsening. Current treatment includes restricted activity and nonsteroidal anti-inflammatory drugs. Prior to being seen today the patient was previously evaluated by a colleague (Dr. Marlou Sa). She reports that she has been having pain in along the lateral aspect of her left hip. She does at times get some pain radiating down the anterior thigh and occasionally to the foot. She denies numbness and tingling in the legs. She has been treated for bursitis in the past, even having a cortisone injection in April. This has not relieved her symptoms. She has not had an intra-articular injection. She is very discouraged as she has had to make significant alterations in her life. She used to walk 2 miles per day as well as swim. Her hip pain has made it almost impossible to do these activities. She is ready to proceed with the hip surgery. They have been treated conservatively in the past for the above stated problem and despite conservative measures, they continue to have progressive  pain and severe functional limitations and dysfunction. They have failed non-operative management including home exercise, medications. It is felt that they would benefit from undergoing total joint replacement. Risks and benefits of the procedure have been discussed with the patient and they elect to proceed with surgery. There are no active contraindications to surgery such as ongoing infection or rapidly progressive neurological disease.   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 live alone Marital status. widowed Exercise. Exercises weekly; does running / walking and other Illicit drug use. no Number of flights of stairs before winded. greater than 5 Pain Contract. no Tobacco use. never smoker Drug/Alcohol Rehab (Previously). no Current work status. working part time Drug/Alcohol Rehab (Currently). no Alcohol use. current drinker; drinks wine; 5-7 per week Children. 2 Advance Directives. Living Will, Healthcare POA Post-Surgical Plans. REHAB    Medication History Anastrozole (1MG  Tablet, Oral) Active. Vitamin D ( Oral) Specific dose unknown - Active. Calcium Carbonate ( Oral) Specific dose unknown - Active. Medications Reconciled.   Past Surgical History Breast Mass; Local Excision. right Cesarean Delivery. 1 time   Review of Systems General:Not Present- Chills, Fever, Night Sweats, Fatigue, Weight Gain, Weight Loss and Memory Loss. Skin:Not Present- Hives, Itching, Rash, Eczema and Lesions. HEENT:Not Present- Tinnitus, Headache, Double Vision, Visual Loss, Hearing Loss and Dentures. Respiratory:Not Present- Shortness of breath with exertion, Shortness of breath at rest, Allergies, Coughing up blood and Chronic Cough. Cardiovascular:Not Present- Chest Pain, Racing/skipping  heartbeats, Difficulty Breathing  Lying Down, Murmur, Swelling and Palpitations. Gastrointestinal:Not Present- Bloody Stool, Heartburn, Abdominal Pain, Vomiting, Nausea, Constipation, Diarrhea, Difficulty Swallowing, Jaundice and Loss of appetitie. Female Genitourinary:Not Present- Blood in Urine, Urinary frequency, Weak urinary stream, Discharge, Flank Pain, Incontinence, Painful Urination, Urgency, Urinary Retention and Urinating at Night. Musculoskeletal:Not Present- Muscle Weakness, Muscle Pain, Joint Swelling, Joint Pain, Back Pain, Morning Stiffness and Spasms. Neurological:Not Present- Tremor, Dizziness, Blackout spells, Paralysis, Difficulty with balance and Weakness. Psychiatric:Not Present- Insomnia.    Vitals Weight: 130 lb Height: 64 in Weight was reported by patient. Height was reported by patient. Body Surface Area: 1.63 m Body Mass Index: 22.31 kg/m Pulse: 64 (Regular) Resp.: 14 (Unlabored) BP: 114/62 (Sitting, Left Arm, Standard)     Physical Exam The physical exam findings are as follows:   General Mental Status - Alert, cooperative and good historian. General Appearance- pleasant. Not in acute distress. Orientation- Oriented X3. Build & Nutrition- Well nourished and Well developed.   Head and Neck Head- normocephalic, atraumatic . Neck Global Assessment- supple. no bruit auscultated on the right and no bruit auscultated on the left.   Eye Vision- Wears corrective lenses. Pupil- Bilateral- Regular and Round. Motion- Bilateral- EOMI.   Chest and Lung Exam Auscultation: Breath sounds:- clear at anterior chest wall and - clear at posterior chest wall. Adventitious sounds:- No Adventitious sounds.   Cardiovascular Auscultation:Rhythm- Regular rate and rhythm. Heart Sounds- S1 WNL and S2 WNL. Murmurs & Other Heart Sounds:Auscultation of the heart reveals - No  Murmurs.   Abdomen Palpation/Percussion:Tenderness- Abdomen is non-tender to palpation. Rigidity (guarding)- Abdomen is soft. Auscultation:Auscultation of the abdomen reveals - Bowel sounds normal.   Female Genitourinary   Note: Not done, not pertinent to present illness  Musculoskeletal   Note: She is a well developed female. Alert and oriented. No apparent distress. The right hip shows a normal range of motion without discomfort.  The left hip can be flexed to 90 with essentially no internal rotation and about 10-15 degrees of external rotation and 20 degrees of abduction. The knee exam is normal bilaterally. Pulse, sensation and motor are intact in both lower extremities. She has a significantly antalgic gait pattern on the left.  RADIOGRAPHS: AP pelvis and lateral of the left hip from June shows she has a significant arthritic change in that hip, just about bone on bone. She had new X-rays today, AP pelvis and lateral of her hip and the joint is essentially gone now. She is completely bone on bone with no evidence of any erosion of the femoral head at this time but significant subchondral cystic changes.   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 Wynelle Link.  Plan is to go to rehab.  PCP - Dr. Tommy Medal - At the time of the H&P, the patient states that she has an appointment with Dr. Minna Antis scheduled.  The patient has contraindications and will not receive TXA (tranexamic acid) prior to surgery - Brearst Cancer.  Signed electronically by Joelene Millin, III PA-C

## 2013-08-18 NOTE — Progress Notes (Signed)
Utilization review completed.  

## 2013-08-19 LAB — BASIC METABOLIC PANEL
BUN: 11 mg/dL (ref 6–23)
CALCIUM: 8.8 mg/dL (ref 8.4–10.5)
CO2: 23 mEq/L (ref 19–32)
CREATININE: 0.64 mg/dL (ref 0.50–1.10)
Chloride: 100 mEq/L (ref 96–112)
GFR calc non Af Amer: 90 mL/min — ABNORMAL LOW (ref >90)
GLUCOSE: 143 mg/dL — AB (ref 70–99)
POTASSIUM: 4.1 meq/L (ref 3.7–5.3)
Sodium: 136 mEq/L — ABNORMAL LOW (ref 137–147)

## 2013-08-19 LAB — CBC
HEMATOCRIT: 33.1 % — AB (ref 36.0–46.0)
HEMOGLOBIN: 11 g/dL — AB (ref 12.0–15.0)
MCH: 29.5 pg (ref 26.0–34.0)
MCHC: 33.2 g/dL (ref 30.0–36.0)
MCV: 88.7 fL (ref 78.0–100.0)
Platelets: 141 10*3/uL — ABNORMAL LOW (ref 150–400)
RBC: 3.73 MIL/uL — ABNORMAL LOW (ref 3.87–5.11)
RDW: 12.5 % (ref 11.5–15.5)
WBC: 13.2 10*3/uL — ABNORMAL HIGH (ref 4.0–10.5)

## 2013-08-19 NOTE — Discharge Instructions (Addendum)
°Dr. Frank Aluisio °Total Joint Specialist °[facility] °[address] °[place], [place] [address] °[phone] ° ° ° °ANTERIOR APPROACH TOTAL HIP REPLACEMENT POSTOPERATIVE DIRECTIONS ° ° °Hip Rehabilitation, Guidelines Following Surgery  °The results of a hip operation are greatly improved after range of motion and muscle strengthening exercises. Follow all safety measures which are given to protect your hip. If any of these exercises cause increased pain or swelling in your joint, decrease the amount until you are comfortable again. Then slowly increase the exercises. Call your caregiver if you have problems or questions.  °HOME CARE INSTRUCTIONS  °Most of the following instructions are designed to prevent the dislocation of your new hip.  °Remove items at home which could result in a fall. This includes throw rugs or furniture in walking pathways.  °Continue medications as instructed at time of discharge. °· You may have some home medications which will be placed on hold until you complete the course of blood thinner medication. °· You may start showering once you are discharged home but do not submerge the incision under water. Just pat the incision dry and apply a dry gauze dressing on daily. °Do not put on socks or shoes without following the instructions of your caregivers.  °Sit on high chairs which makes it easier to stand.  °Sit on chairs with arms. Use the chair arms to help push yourself up when arising.  °Keep your leg on the side of the operation out in front of you when standing up.  °Arrange for the use of a toilet seat elevator so you are not sitting low.   °· Walk with walker as instructed.  °You may resume a sexual relationship in one month or when given the OK by your caregiver.  °Use walker as long as suggested by your caregivers.  °You may put full weight on your legs and walk as much as is comfortable. °Avoid periods of inactivity such as sitting longer than an hour  when not asleep. This helps prevent blood clots.  °You may return to work once you are cleared by your surgeon.  °Do not drive a car for 6 weeks or until released by your surgeon.  °Do not drive while taking narcotics.  °Wear elastic stockings for three weeks following surgery during the day but you may remove then at night.  °Make sure you keep all of your appointments after your operation with all of your doctors and caregivers. You should call the office at the above phone number and make an appointment for approximately two weeks after the date of your surgery. °Change the dressing daily and reapply a dry dressing each time. °Please pick up a stool softener and laxative for home use as long as you are requiring pain medications. °· Continue to use ice on the hip for pain and swelling from surgery. You may notice swelling that will progress down to the foot and ankle.  This is normal after  surgery.  Elevate the leg when you are not up walking on it.   °It is important for you to complete the blood thinner medication as prescribed by your doctor. °· Continue to use the breathing machine which will help keep your temperature down.  It is common for your temperature to cycle up and down following surgery, especially at night when you are not up moving around and exerting yourself.  The breathing machine keeps your lungs expanded and your temperature down. ° °RANGE OF MOTION AND STRENGTHENING EXERCISES  °  These exercises are designed to help you keep full movement of your hip joint. Follow your caregiver's or physical therapist's instructions. Perform all exercises about fifteen times, three times per day or as directed. Exercise both hips, even if you have had only one joint replacement. These exercises can be done on a training (exercise) mat, on the floor, on a table or on a bed. Use whatever works the best and is most comfortable for you. Use music or television while you are exercising so that the exercises are  a pleasant break in your day. This will make your life better with the exercises acting as a break in routine you can look forward to.  Lying on your back, slowly slide your foot toward your buttocks, raising your knee up off the floor. Then slowly slide your foot back down until your leg is straight again.  Lying on your back spread your legs as far apart as you can without causing discomfort.  Lying on your side, raise your upper leg and foot straight up from the floor as far as is comfortable. Slowly lower the leg and repeat.  Lying on your back, tighten up the muscle in the front of your thigh (quadriceps muscles). You can do this by keeping your leg straight and trying to raise your heel off the floor. This helps strengthen the largest muscle supporting your knee.  Lying on your back, tighten up the muscles of your buttocks both with the legs straight and with the knee bent at a comfortable angle while keeping your heel on the floor.   SKILLED REHAB INSTRUCTIONS: If the patient is transferred to a skilled rehab facility following release from the hospital, a list of the current medications will be sent to the facility for the patient to continue.  When discharged from the skilled rehab facility, please have the facility set up the patient's [place] prior to being released. Also, the skilled facility will be responsible for providing the patient with their medications at time of release from the facility to include their pain medication, the muscle relaxants, and their blood thinner medication. If the patient is still at the rehab facility at time of the two week follow up appointment, the skilled rehab facility will also need to assist the patient in arranging follow up appointment in our office and any transportation needs.  MAKE SURE YOU:  Understand these instructions.  Will watch your condition.  Will get help right away if you are not doing well or get worse.  Pick up  stool softner and laxative for home. Do not submerge incision under water. May shower. Continue to use ice for pain and swelling from surgery. Total Hip Protocol.  Take Xarelto for two and a half more weeks, then discontinue Xarelto. Once the patient has completed the blood thinner regimen, then take a Baby 81 mg Aspirin daily for three more weeks.  When discharged from the skilled rehab facility, please have the facility set up the patient's [place] prior to being released.  Also provide the patient with their medications at time of release from the facility to include their pain medication, the muscle relaxants, and their blood thinner medication.  If the patient is still at the rehab facility at time of follow up appointment, please also assist the patient in arranging follow up appointment in our office and any transportation needs.    Information on my medicine - XARELTO (Rivaroxaban)  This medication education was reviewed  with me or my healthcare representative as part of my discharge preparation.  The pharmacist that spoke with me during my hospital stay was:  Jaquita Folds, [facility]  Why was Xarelto prescribed for you? Xarelto was prescribed for you to reduce the risk of blood clots forming after orthopedic surgery. The medical term for these abnormal blood clots is venous thromboembolism (VTE).  What do you need to know about xarelto ? Take your Xarelto ONCE DAILY at the same time every day. You may take it either with or without food.  If you have difficulty swallowing the tablet whole, you may crush it and mix in applesauce just prior to taking your dose.  Take Xarelto exactly as prescribed by your doctor and DO NOT stop taking Xarelto without talking to the doctor who prescribed the medication.  Stopping without other VTE prevention medication to take the place of Xarelto may increase your risk of developing a clot.  After discharge, you should have  regular check-up appointments with your healthcare provider that is prescribing your Xarelto.    What do you do if you miss a dose? If you miss a dose, take it as soon as you remember on the same day then continue your regularly scheduled once daily regimen the next day. Do not take two doses of Xarelto on the same day.   Important Safety Information A possible side effect of Xarelto is bleeding. You should call your healthcare provider right away if you experience any of the following:   Bleeding from an injury or your nose that does not stop.   Unusual colored urine (red or dark brown) or unusual colored stools (red or black).   Unusual bruising for unknown reasons.   A serious fall or if you hit your head (even if there is no bleeding).  Some medicines may interact with Xarelto and might increase your risk of bleeding while on Xarelto. To help avoid this, consult your healthcare provider or pharmacist prior to using any new prescription or non-prescription medications, including herbals, vitamins, non-steroidal anti-inflammatory drugs (NSAIDs) and supplements.  This website has more information on Xarelto: [email or web address]

## 2013-08-19 NOTE — Evaluation (Addendum)
Occupational Therapy Evaluation Patient Details Name: Sarah Lester MRN: [id number] DOB: 01-14-1946 Today's Date: 08/19/2013    History of Present Illness left direct anterior THA   Clinical Impression   Pt s/p above procedure. Pt lives alone and planning to d/c to SNF for further rehab. All further needs can be addressed at next venue of care.     Follow Up Recommendations  SNF    Equipment Recommendations  Other (comment) (defer to next venue)    Recommendations for Other Services       Precautions / Restrictions Precautions Precautions: None Precaution Comments: direct anterior Restrictions Weight Bearing Restrictions: Yes LLE Weight Bearing: Weight bearing as tolerated      Mobility Bed Mobility Overal bed mobility: Needs Assistance Bed Mobility: Supine to Sit;Sit to Supine     Supine to sit: HOB elevated;Modified independent (Device/Increase time) Sit to supine: Min assist   General bed mobility comments: Assistance with LLE. Educated to hook RLE around LLE and also told pt she could use sheet to assist.  Transfers Overall transfer level: Needs assistance Equipment used: Rolling walker (2 wheeled) Transfers: Sit to/from Stand Sit to Stand: Supervision         General transfer comment: Cues for hand placement.    Balance Overall balance assessment: No apparent balance deficits (not formally assessed)                                          ADL Overall ADL's : Needs assistance/impaired Eating/Feeding: Independent;Sitting   Grooming: Set up;Sitting   Upper Body Bathing: Set up;Sitting   Lower Body Bathing: Min guard;Sit to/from stand   Upper Body Dressing : Set up;Sitting   Lower Body Dressing: Min guard;Sit to/from stand   Toilet Transfer: Min guard;Ambulation;RW (chair)   Toileting- Clothing Manipulation and Hygiene: Min guard;Sit to/from stand       Functional mobility during ADLs: Min guard;Rolling  walker General ADL Comments: Educated on use of bag on walker. Educated on dressing technique and safe shoe wear. Discussed having rugs picked up in house.     Vision                     Perception     Praxis      Pertinent Vitals/Pain Pain 3/10. Repositioned.      Hand Dominance     Extremity/Trunk Assessment Upper Extremity Assessment Upper Extremity Assessment: Overall WFL for tasks assessed   Lower Extremity Assessment Lower Extremity Assessment: Defer to PT evaluation       Communication Communication Communication: No difficulties   Cognition Arousal/Alertness: Awake/alert Behavior During Therapy: WFL for tasks assessed/performed Overall Cognitive Status: Within Functional Limits for tasks assessed                     General Comments          Shoulder Instructions      Home Living Family/patient expects to be discharged to:: Skilled nursing facility Living Arrangements: Alone Comments: Pt has regular height commode and tub/shower                               Additional Comments: pt plans to d/c to [facility]      Prior Functioning/Environment Level of Independence: Independent  OT Diagnosis: Acute pain   OT Problem List: Decreased knowledge of use of DME or AE;Pain   OT Treatment/Interventions:      OT Goals(Current goals can be found in the care plan section)   OT Frequency:     Barriers to D/C:            Co-evaluation              End of Session Equipment Utilized During Treatment: Rolling walker  Activity Tolerance: Patient tolerated treatment well Patient left: in bed;with call bell/phone within reach   Time: 7680-8811 OT Time Calculation (min): 11 min Charges:  OT General Charges $OT Visit: 1 Procedure OT Evaluation $Initial OT Evaluation Tier I: 1 Procedure G-CodesBenito Mccreedy OTR/L [phone] 08/19/2013, 3:54 PM

## 2013-08-19 NOTE — Evaluation (Signed)
Physical Therapy Evaluation Patient Details Name: Sarah Lester MRN: [id number] DOB: 1946-04-09 Today's Date: 08/19/2013   History of Present Illness  left direct anterior THA  Clinical Impression  Pt tolerated evaluation well, ambulated within room with RW and min-guard A. BP increased to 143/90 in sitting and pt not dizzy with activity. Pt will benefit from skilled PT to increase mobility and independence before d/c to rehab to prepare for return home. Agree with SNF rec as pt lives alone.     Follow Up Recommendations SNF    Equipment Recommendations  None recommended by PT    Recommendations for Other Services       Precautions / Restrictions Precautions Precautions: None Precaution Comments: direct anterior Restrictions Weight Bearing Restrictions: Yes RUE Weight Bearing: Weight bearing as tolerated      Mobility  Bed Mobility Overal bed mobility: Needs Assistance Bed Mobility: Supine to Sit     Supine to sit: Min assist     General bed mobility comments: vc's for sequencing, min A to get to EOB and support LLE  Transfers Overall transfer level: Needs assistance Equipment used: Rolling walker (2 wheeled) Transfers: Sit to/from Stand Sit to Stand: Min guard         General transfer comment: vc's for hand placement  Ambulation/Gait Ambulation/Gait assistance: Min guard Ambulation Distance (Feet): 30 Feet Assistive device: Rolling walker (2 wheeled) Gait Pattern/deviations: Step-through pattern;Decreased weight shift to left;Decreased stance time - left Gait velocity: decreased   General Gait Details: vc's for sequencing  Stairs            Wheelchair Mobility    Modified Rankin (Stroke Patients Only)       Balance Overall balance assessment: No apparent balance deficits (not formally assessed)                                           Pertinent Vitals/Pain 4/10 left hip pain, ice applied BP supine 85/39, sitting  143/90    Home Living Family/patient expects to be discharged to:: Skilled nursing facility Living Arrangements: Alone               Additional Comments: pt plans [facility]    Prior Function Level of Independence: Independent               Hand Dominance        Extremity/Trunk Assessment   Upper Extremity Assessment: Overall WFL for tasks assessed           Lower Extremity Assessment: LLE deficits/detail   LLE Deficits / Details: hip flex 2/5, quad 3/5  Cervical / Trunk Assessment: Normal  Communication   Communication: No difficulties  Cognition Arousal/Alertness: Awake/alert Behavior During Therapy: WFL for tasks assessed/performed Overall Cognitive Status: Within Functional Limits for tasks assessed                      General Comments      Exercises Total Joint Exercises Ankle Circles/Pumps: AROM;Both;20 reps;Seated Quad Sets: AROM;15 reps;Both;Seated Gluteal Sets: AROM;15 reps;Seated Long Arc Quad: 5 reps;AROM;Left;Seated      Assessment/Plan    PT Assessment Patient needs continued PT services  PT Diagnosis Difficulty walking;Abnormality of gait;Acute pain   PT Problem List Decreased strength;Decreased mobility;Decreased knowledge of use of DME;Decreased knowledge of precautions;Pain;Decreased activity tolerance  PT Treatment Interventions DME instruction;Gait training;Functional mobility training;Therapeutic  activities;Therapeutic exercise;Patient/family education   PT Goals (Current goals can be found in the Care Plan section) Acute Rehab PT Goals Patient Stated Goal: rehab then home PT Goal Formulation: With patient Time For Goal Achievement: 08/26/13 Potential to Achieve Goals: Good    Frequency 7X/week   Barriers to discharge Decreased caregiver support plans to go to SNF for rehab    Co-evaluation               End of Session Equipment Utilized During Treatment: Gait belt Activity Tolerance:  Patient tolerated treatment well Patient left: in chair;with call bell/phone within reach Nurse Communication: Mobility status         Time: 0900-0939 PT Time Calculation (min): 39 min   Charges:   PT Evaluation $Initial PT Evaluation Tier I: 1 Procedure PT Treatments $Gait Training: 8-22 mins $Therapeutic Activity: 8-22 mins   PT G Codes:        Leighton Roach, PT  Acute Rehab Services  [place] 08/19/2013, 11:10 AM

## 2013-08-19 NOTE — Progress Notes (Addendum)
Clinical Social Work Department CLINICAL SOCIAL WORK PLACEMENT NOTE 08/19/2013  Patient:  Sarah Lester, Sarah Lester  Account Number:  [id number] Admit date:  08/18/2013  Clinical Social Worker:  Blima Rich, Latanya Presser  Date/time:  08/19/2013 04:23 PM  Clinical Social Work is seeking post-discharge placement for this patient at the following level of care:   [place]   (*CSW will update this form in Epic as items are completed)   08/19/2013  Patient/family provided with [place] Department of Clinical Social Work's list of facilities offering this level of care within the geographic area requested by the patient (or if unable, by the patient's family).  08/19/2013  Patient/family informed of their freedom to choose among providers that offer the needed level of care, that participate in Medicare, Medicaid or managed care program needed by the patient, have an available bed and are willing to accept the patient.  08/19/2013  Patient/family informed of [facility]' ownership interest in [facility], as well as of the fact that they are under no obligation to receive care at this facility.  PASARR submitted to EDS on 08/19/2013 PASARR number received from [employer] on 08/19/2013  FL2 transmitted to all facilities in geographic area requested by pt/family on  08/19/2013 FL2 transmitted to all facilities within larger geographic area on   Patient informed that his/her managed care company has contracts with or will negotiate with  certain facilities, including the following:     Patient/family informed of bed offers received:   Patient chooses bed at [facility] Physician recommends and patient chooses bed at    Patient to be transferred to  on  08/21/2013 Patient to be transferred to facility by [facility]  The following physician request were entered in Epic:   Additional Comments:

## 2013-08-19 NOTE — Progress Notes (Signed)
Patient has had low BP during this shift.  Patient has been asymptomatic.  She has been alert and oriented.  Some n/v around 0230 this am but has seem to resolve.  She wanted to remain on clear liquid diet.  Pain has been controlled.

## 2013-08-19 NOTE — Progress Notes (Signed)
Physical Therapy Treatment Patient Details Name: Sarah Lester MRN: [id number] DOB: 1946-04-27 Today's Date: 08/19/2013    History of Present Illness left direct anterior THA    PT Comments    Pt progressing well with ambulation and exercises. Ambulated 100' with RW and min-guard A. PT will continue to follow.  Follow Up Recommendations  SNF     Equipment Recommendations  None recommended by PT    Recommendations for Other Services       Precautions / Restrictions Precautions Precautions: None Precaution Comments: direct anterior Restrictions Weight Bearing Restrictions: Yes RUE Weight Bearing: Weight bearing as tolerated    Mobility  Bed Mobility Overal bed mobility: Needs Assistance Bed Mobility: Supine to Sit;Sit to Supine     Supine to sit: Min assist Sit to supine: Min assist   General bed mobility comments: min A to left leg for out and back into bed  Transfers Overall transfer level: Needs assistance Equipment used: Rolling walker (2 wheeled) Transfers: Sit to/from Stand Sit to Stand: Min guard         General transfer comment: min-guard A for safety, pt with safe placement of hands  Ambulation/Gait Ambulation/Gait assistance: Min guard Ambulation Distance (Feet): 100 Feet Assistive device: Rolling walker (2 wheeled) Gait Pattern/deviations: Step-through pattern;Decreased stride length;Decreased step length - left;Decreased stance time - left;Decreased weight shift to left Gait velocity: decreased   General Gait Details: pt tolerating increased distance without increased pain, educated pt on proper gait pattern and encouraged her to use RW for all ambulation at this point   Science writer    Modified Rankin (Stroke Patients Only)       Balance Overall balance assessment: No apparent balance deficits (not formally assessed)                                  Cognition Arousal/Alertness:  Awake/alert Behavior During Therapy: WFL for tasks assessed/performed Overall Cognitive Status: Within Functional Limits for tasks assessed                      Exercises Total Joint Exercises Ankle Circles/Pumps: AROM;Both;20 reps;Seated Quad Sets: AROM;15 reps;Both;Seated Gluteal Sets: AROM;15 reps;Seated Heel Slides: AROM;Left;10 reps;Supine Hip ABduction/ADduction: AROM;10 reps;Left;Supine Straight Leg Raises: AAROM;Left;10 reps;Supine    General Comments        Pertinent Vitals/Pain VSS    Home Living                      Prior Function            PT Goals (current goals can now be found in the care plan section) Acute Rehab PT Goals Patient Stated Goal: rehab then home PT Goal Formulation: With patient Time For Goal Achievement: 08/26/13 Potential to Achieve Goals: Good Progress towards PT goals: Progressing toward goals    Frequency  7X/week    PT Plan Current plan remains appropriate    Co-evaluation             End of Session Equipment Utilized During Treatment: Gait belt Activity Tolerance: Patient tolerated treatment well Patient left: in bed;with call bell/phone within reach     Time: 1751-0258 PT Time Calculation (min): 24 min  Charges:  $Gait Training: 8-22 mins $Therapeutic Exercise: 8-22 mins  G Codes:     Leighton Roach, PT  Acute Rehab Services  [place] 08/19/2013, 3:43 PM

## 2013-08-19 NOTE — Progress Notes (Signed)
Orthopedics Progress Note  Subjective: I feel weak  Objective:  Filed Vitals:   08/19/13 0456  BP: 82/39  Pulse: 68  Temp: 98.2 F (36.8 C)  Resp: 16    General: Awake and alert  Musculoskeletal: left hip dressing intact and drain pulled, leg lengths equal, NVI Pulse regular at 70 bpm Neurovascularly intact  Lab Results  Component Value Date   WBC 13.2* 08/19/2013   HGB 11.0* 08/19/2013   HCT 33.1* 08/19/2013   MCV 88.7 08/19/2013   PLT 141* 08/19/2013       Component Value Date/Time   NA 136* 08/19/2013 0705   NA 138 06/02/2013 0849   K 4.1 08/19/2013 0705   K 4.0 06/02/2013 0849   CL 100 08/19/2013 0705   CL 105 06/06/2012 0828   CO2 23 08/19/2013 0705   CO2 26 06/02/2013 0849   GLUCOSE 143* 08/19/2013 0705   GLUCOSE 78 06/02/2013 0849   GLUCOSE 95 06/06/2012 0828   BUN 11 08/19/2013 0705   BUN 10.1 06/02/2013 0849   CREATININE 0.64 08/19/2013 0705   CREATININE 0.8 06/02/2013 0849   CALCIUM 8.8 08/19/2013 0705   CALCIUM 9.8 06/02/2013 0849   GFRNONAA 90* 08/19/2013 0705   GFRAA >90 08/19/2013 0705    Lab Results  Component Value Date   INR 1.01 08/08/2013    Assessment/Plan: POD #1 s/p Procedure(s): LEFT TOTAL HIP ARTHROPLASTY ANTERIOR APPROACH Other than the low blood pressure, she looks great.  No tachycardia, anemia is mild  Will continue volume resuscitation and observation.  D/C plan is for [facility] on Monday  Doran Heater. Veverly Fells, MD 08/19/2013 9:16 AM

## 2013-08-19 NOTE — Progress Notes (Deleted)
OT Cancellation Note  Patient Details Name: Sarah Lester MRN: [id number] DOB: 1946-04-05   Cancelled Treatment:    Reason Eval/Treat Not Completed: OT screened.  Pt current D/C plan is SNF. No apparent immediate acute care OT needs, therefore will defer OT to SNF. If OT eval is needed please call Acute Rehab Dept. at [phone] or text page OT at [phone].    Benito Mccreedy OTR/L [phone] 08/19/2013, 12:11 PM

## 2013-08-19 NOTE — Care Management Note (Signed)
CARE MANAGEMENT NOTE 08/19/2013  Patient:  Sarah Lester, Sarah Lester   Account Number:  [id number]  Date Initiated:  08/19/2013  Documentation initiated by:  Ricki Miller  Subjective/Objective Assessment:   68 yr old female s/p left total hip arthroplasty.     Action/Plan:   Case manager spoke with patient concerning discharge plans. She states she will be going to [facility]. CM notified Education officer, museum.   Anticipated DC Date:  08/21/2013   Anticipated DC Plan:  SKILLED NURSING FACILITY  In-house referral  Clinical Social Worker      DC Planning Services  CM consult      Choice offered to / List presented to:             Status of service:  In process, will continue to follow

## 2013-08-19 NOTE — Progress Notes (Signed)
Clinical Social Work Department BRIEF PSYCHOSOCIAL ASSESSMENT 08/19/2013  Patient:  VIA, ROSADO     Account Number:  [id number]     Admit date:  08/18/2013  Clinical Social Worker:  Rolinda Roan  Date/Time:  08/19/2013 04:18 PM  Referred by:  Physician  Date Referred:  08/19/2013 Referred for  SNF Placement   Other Referral:   Interview type:  Patient Other interview type:    PSYCHOSOCIAL DATA Living Status:  ALONE Admitted from facility:   Level of care:   Primary support name:  Ranee Peasley Primary support relationship to patient:  CHILD, ADULT Degree of support available:   Good support per patient.    CURRENT CONCERNS  Other Concerns:    SOCIAL WORK ASSESSMENT / PLAN Clinical Social Worker (CSW) met with patient to discuss SNF placement. Patient reported that she wants to go to [facility] and she has pre-registered there. CSW encouraged patient to think about a back up SNF [place] does not have a bed available. Patient verbalized her understanding.   Assessment/plan status:  Psychosocial Support/Ongoing Assessment of Needs Other assessment/ plan:   Information/referral to community resources:   CSW gave patient SNF list.    PATIENT'S/FAMILY'S RESPONSE TO PLAN OF CARE: Patient thanked CSW for visit and assisting with placement process.

## 2013-08-20 LAB — BASIC METABOLIC PANEL
BUN: 9 mg/dL (ref 6–23)
CO2: 26 mEq/L (ref 19–32)
Calcium: 8.5 mg/dL (ref 8.4–10.5)
Chloride: 106 mEq/L (ref 96–112)
Creatinine, Ser: 0.66 mg/dL (ref 0.50–1.10)
GFR, EST NON AFRICAN AMERICAN: 89 mL/min — AB (ref >90)
GLUCOSE: 106 mg/dL — AB (ref 70–99)
POTASSIUM: 3.7 meq/L (ref 3.7–5.3)
SODIUM: 142 meq/L (ref 137–147)

## 2013-08-20 LAB — CBC
HCT: 30.2 % — ABNORMAL LOW (ref 36.0–46.0)
Hemoglobin: 10.2 g/dL — ABNORMAL LOW (ref 12.0–15.0)
MCH: 30.4 pg (ref 26.0–34.0)
MCHC: 33.8 g/dL (ref 30.0–36.0)
MCV: 89.9 fL (ref 78.0–100.0)
PLATELETS: 99 10*3/uL — AB (ref 150–400)
RBC: 3.36 MIL/uL — ABNORMAL LOW (ref 3.87–5.11)
RDW: 12.9 % (ref 11.5–15.5)
WBC: 10.4 10*3/uL (ref 4.0–10.5)

## 2013-08-20 NOTE — Progress Notes (Signed)
    Subjective: 2 Days Post-Op Procedure(s) (LRB): LEFT TOTAL HIP ARTHROPLASTY ANTERIOR APPROACH (Left) Patient reports pain as 2 on 0-10 scale.   Denies CP or SOB.  Voiding without difficulty. Positive flatus. Objective: Vital signs in last 24 hours: Temp:  [98.3 F (36.8 C)-98.6 F (37 C)] 98.3 F (36.8 C) (04/26 0646) Pulse Rate:  [72-77] 72 (04/26 0646) Resp:  [16-19] 19 (04/26 0646) BP: (98-109)/(34-51) 99/44 mmHg (04/26 0646) SpO2:  [100 %] 100 % (04/26 0646)  Intake/Output from previous day: 04/25 0701 - 04/26 0700 In: 440 [P.O.:440] Out: -  Intake/Output this shift:    Labs:  Recent Labs  08/19/13 0705 08/20/13 0430  HGB 11.0* 10.2*    Recent Labs  08/19/13 0705 08/20/13 0430  WBC 13.2* 10.4  RBC 3.73* 3.36*  HCT 33.1* 30.2*  PLT 141* 99*    Recent Labs  08/19/13 0705 08/20/13 0430  NA 136* 142  K 4.1 3.7  CL 100 106  CO2 23 26  BUN 11 9  CREATININE 0.64 0.66  GLUCOSE 143* 106*  CALCIUM 8.8 8.5   No results found for this basename: LABPT, INR,  in the last 72 hours  Physical Exam: Neurologically intact ABD soft Intact pulses distally Incision: dressing C/D/I Compartment soft  Assessment/Plan: 2 Days Post-Op Procedure(s) (LRB): LEFT TOTAL HIP ARTHROPLASTY ANTERIOR APPROACH (Left) Advance diet Up with therapy Mobilization with therapy  Melina Schools for Dr. Melina Schools [facility] [phone] 08/20/2013, 9:43 AM

## 2013-08-20 NOTE — Progress Notes (Signed)
Physical Therapy Treatment Patient Details Name: Sarah Lester MRN: [id number] DOB: 08/08/1945 Today's Date: 08/20/2013    History of Present Illness left direct anterior THA    PT Comments    Patient ambulating increased distance today but continues to require some assist with some tasks for mobility. Patient progressing well.   Follow Up Recommendations  SNF     Equipment Recommendations  None recommended by PT    Recommendations for Other Services       Precautions / Restrictions Precautions Precautions: None Precaution Comments: direct anterior Restrictions Weight Bearing Restrictions: Yes RUE Weight Bearing: Weight bearing as tolerated    Mobility  Bed Mobility Overal bed mobility: Needs Assistance Bed Mobility: Supine to Sit;Sit to Supine     Supine to sit: Min assist Sit to supine: Min assist   General bed mobility comments: Min assist for LE elevation and increased time to perform  Transfers Overall transfer level: Needs assistance Equipment used: Rolling walker (2 wheeled) Transfers: Sit to/from Stand Sit to Stand: Min guard         General transfer comment: Min guard for stability  Ambulation/Gait Ambulation/Gait assistance: Min guard Ambulation Distance (Feet): 100 Feet (x4 with standing rest breaks ) Assistive device: Rolling walker (2 wheeled) Gait Pattern/deviations: Step-through pattern;Decreased stride length;Decreased step length - left;Decreased stance time - left;Decreased weight shift to left Gait velocity: decreased   General Gait Details: Patient gait pattern improving, tolerating ambulation well but increased fatigue evident with longer distances.    Stairs            Wheelchair Mobility    Modified Rankin (Stroke Patients Only)       Balance Overall balance assessment: No apparent balance deficits (not formally assessed)                                  Cognition Arousal/Alertness:  Awake/alert Behavior During Therapy: WFL for tasks assessed/performed Overall Cognitive Status: Within Functional Limits for tasks assessed                      Exercises      General Comments General comments (skin integrity, edema, etc.): assisted patient with in room mobility and ADL activities. Patient educated on techniques for bed mobility to assist with pain management during trasnitions. patient declined ther-ex this session.      Pertinent Vitals/Pain 5/10    Home Living                      Prior Function            PT Goals (current goals can now be found in the care plan section) Acute Rehab PT Goals Patient Stated Goal: rehab then home PT Goal Formulation: With patient Time For Goal Achievement: 08/26/13 Potential to Achieve Goals: Good Progress towards PT goals: Progressing toward goals    Frequency  7X/week    PT Plan Current plan remains appropriate    Co-evaluation             End of Session Equipment Utilized During Treatment: Gait belt Activity Tolerance: Patient tolerated treatment well Patient left: in bed;with call bell/phone within reach     Time: 1401-1426 PT Time Calculation (min): 25 min  Charges:  $Gait Training: 8-22 mins $Therapeutic Activity: 8-22 mins  G Codes:      Duncan Dull 08/20/2013, 2:37 PM Alben Deeds, [place] DPT  [phone]

## 2013-08-20 NOTE — Progress Notes (Signed)
Patient's father in ED. Other family members with ed patient. Patient with Dr Rolena Infante for dressing change, suture site with steri strips intact. Puncture site from hemovac w/o s/s of infection. Myueplex dressing placed per MD. Physician states he does not give permission for patients to leave floor. Patient initially stated she did not feel well enough to go to ED but has changed her mind after visiting with her sons. Dr Rolena Infante continues to not give permission for patients to leave hospital floors during stay.

## 2013-08-21 ENCOUNTER — Other Ambulatory Visit: Payer: Self-pay | Admitting: *Deleted

## 2013-08-21 LAB — CBC
HCT: 33 % — ABNORMAL LOW (ref 36.0–46.0)
HEMOGLOBIN: 10.9 g/dL — AB (ref 12.0–15.0)
MCH: 29.6 pg (ref 26.0–34.0)
MCHC: 33 g/dL (ref 30.0–36.0)
MCV: 89.7 fL (ref 78.0–100.0)
Platelets: 109 10*3/uL — ABNORMAL LOW (ref 150–400)
RBC: 3.68 MIL/uL — AB (ref 3.87–5.11)
RDW: 12.8 % (ref 11.5–15.5)
WBC: 8.2 10*3/uL (ref 4.0–10.5)

## 2013-08-21 MED ORDER — OXYCODONE HCL 5 MG PO TABS: 5.0000 mg | ORAL_TABLET | ORAL | Status: DC | PRN

## 2013-08-21 MED ORDER — METHOCARBAMOL 500 MG PO TABS: 500.0000 mg | ORAL_TABLET | Freq: Four times a day (QID) | ORAL | Status: DC | PRN

## 2013-08-21 MED ORDER — TRAMADOL HCL 50 MG PO TABS: 50.0000 mg | ORAL_TABLET | Freq: Four times a day (QID) | ORAL | Status: DC | PRN

## 2013-08-21 MED ORDER — POLYETHYLENE GLYCOL 3350 17 G PO PACK: 17.0000 g | PACK | Freq: Every day | ORAL | Status: DC | PRN

## 2013-08-21 MED ORDER — DSS 100 MG PO CAPS: 100.0000 mg | ORAL_CAPSULE | Freq: Two times a day (BID) | ORAL | Status: DC

## 2013-08-21 MED ORDER — ONDANSETRON HCL 4 MG PO TABS: 4.0000 mg | ORAL_TABLET | Freq: Four times a day (QID) | ORAL | Status: DC | PRN

## 2013-08-21 MED ORDER — RIVAROXABAN 10 MG PO TABS: 10.0000 mg | ORAL_TABLET | Freq: Every day | ORAL | Status: DC

## 2013-08-21 MED ORDER — ACETAMINOPHEN 325 MG PO TABS: 650.0000 mg | ORAL_TABLET | Freq: Four times a day (QID) | ORAL | Status: DC | PRN

## 2013-08-21 MED ORDER — METOCLOPRAMIDE HCL 5 MG PO TABS: 5.0000 mg | ORAL_TABLET | Freq: Three times a day (TID) | ORAL | Status: DC | PRN

## 2013-08-21 NOTE — Discharge Summary (Signed)
Physician Discharge Summary   Patient ID: Sarah Lester MRN: [id number] DOB/AGE: 1945/09/09 68 y.o.  Admit date: 08/18/2013 Discharge date: 08/21/2013  Primary Diagnosis:  Osteoarthritis of the Left hip.   Admission Diagnoses:  Past Medical History  Diagnosis Date  . 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 Complication of anesthesia     long time to wake up  . PONV (postoperative nausea and vomiting)   . Arthritis    Discharge Diagnoses:   Principal Problem:   OA (osteoarthritis) of hip  Estimated body mass index is 22.5 kg/(m^2) as calculated from the following:   Height as of this encounter: _0  (1.6 m).   Weight as of this encounter: 57.607 kg (127 lb).  Procedure(s) (LRB): LEFT TOTAL HIP ARTHROPLASTY ANTERIOR APPROACH (Left)   Consults: None  HPI: Sarah Lester is a 68 y.o. female who has advanced end-  stage arthritis of his Left hip with progressively worsening pain and  dysfunction.The patient has failed nonoperative management and presents for  total hip arthroplasty.   Laboratory Data: Admission on 08/18/2013  Component Date Value Ref Range Status  . WBC 08/19/2013 13.2* 4.0 - 10.5 K/uL Final  . RBC 08/19/2013 3.73* 3.87 - 5.11 MIL/uL Final  . Hemoglobin 08/19/2013 11.0* 12.0 - 15.0 g/dL Final  . HCT 08/19/2013 33.1* 36.0 - 46.0 % Final  . MCV 08/19/2013 88.7  78.0 - 100.0 fL Final  . MCH 08/19/2013 29.5  26.0 - 34.0 pg Final  . MCHC 08/19/2013 33.2  30.0 - 36.0 g/dL Final  . RDW 08/19/2013 12.5  11.5 - 15.5 % Final  . Platelets 08/19/2013 141* 150 - 400 K/uL Final  . Sodium 08/19/2013 136* 137 - 147 mEq/L Final  . Potassium 08/19/2013 4.1  3.7 - 5.3 mEq/L Final  . Chloride 08/19/2013 100  96 - 112 mEq/L Final  . CO2 08/19/2013 23  19 - 32 mEq/L Final  . Glucose, Bld 08/19/2013 143* 70 - 99 mg/dL Final  . BUN 08/19/2013 11  6 - 23 mg/dL Final  . Creatinine, Ser 08/19/2013 0.64  0.50 - 1.10 mg/dL Final  . Calcium 08/19/2013 8.8  8.4 - 10.5 mg/dL  Final  . GFR calc non Af Amer 08/19/2013 90* >90 mL/min Final  . GFR calc Af Amer 08/19/2013 >90  >90 mL/min Final   Comment: (NOTE)                          The eGFR has been calculated using the CKD EPI equation.                          This calculation has not been validated in all clinical situations.                          eGFR's persistently <90 mL/min signify possible Chronic Kidney                          Disease.  . WBC 08/20/2013 10.4  4.0 - 10.5 K/uL Final  . RBC 08/20/2013 3.36* 3.87 - 5.11 MIL/uL Final  . Hemoglobin 08/20/2013 10.2* 12.0 - 15.0 g/dL Final  . HCT 08/20/2013 30.2* 36.0 - 46.0 % Final  . MCV 08/20/2013 89.9  78.0 - 100.0 fL Final  . MCH 08/20/2013 30.4  26.0 - 34.0 pg Final  .  MCHC 08/20/2013 33.8  30.0 - 36.0 g/dL Final  . RDW 08/20/2013 12.9  11.5 - 15.5 % Final  . Platelets 08/20/2013 99* 150 - 400 K/uL Final   Comment: PLATELET COUNT CONFIRMED BY SMEAR                          REPEATED TO VERIFY  . Sodium 08/20/2013 142  137 - 147 mEq/L Final  . Potassium 08/20/2013 3.7  3.7 - 5.3 mEq/L Final  . Chloride 08/20/2013 106  96 - 112 mEq/L Final  . CO2 08/20/2013 26  19 - 32 mEq/L Final  . Glucose, Bld 08/20/2013 106* 70 - 99 mg/dL Final  . BUN 08/20/2013 9  6 - 23 mg/dL Final  . Creatinine, Ser 08/20/2013 0.66  0.50 - 1.10 mg/dL Final  . Calcium 08/20/2013 8.5  8.4 - 10.5 mg/dL Final  . GFR calc non Af Amer 08/20/2013 89* >90 mL/min Final  . GFR calc Af Amer 08/20/2013 >90  >90 mL/min Final   Comment: (NOTE)                          The eGFR has been calculated using the CKD EPI equation.                          This calculation has not been validated in all clinical situations.                          eGFR's persistently <90 mL/min signify possible Chronic Kidney                          Disease.  . WBC 08/21/2013 8.2  4.0 - 10.5 K/uL Final  . RBC 08/21/2013 3.68* 3.87 - 5.11 MIL/uL Final  . Hemoglobin 08/21/2013 10.9* 12.0 - 15.0 g/dL Final  .  HCT 08/21/2013 33.0* 36.0 - 46.0 % Final  . MCV 08/21/2013 89.7  78.0 - 100.0 fL Final  . MCH 08/21/2013 29.6  26.0 - 34.0 pg Final  . MCHC 08/21/2013 33.0  30.0 - 36.0 g/dL Final  . RDW 08/21/2013 12.8  11.5 - 15.5 % Final  . Platelets 08/21/2013 109* 150 - 400 K/uL Final   [facility] Outpatient Visit on 08/08/2013  Component Date Value Ref Range Status  . MRSA, PCR 08/08/2013 NEGATIVE  NEGATIVE Final  . Staphylococcus aureus 08/08/2013 NEGATIVE  NEGATIVE Final   Comment:                                 The Xpert SA Assay (FDA                          approved for NASAL specimens                          in patients over 37 years of age),                          is one component of  a comprehensive surveillance                          program.  Test performance has                          been validated by [facility] for patients greater                          than or equal to 47 year old.                          It is not intended                          to diagnose infection nor to                          guide or monitor treatment.  Marland Kitchen aPTT 08/08/2013 29  24 - 37 seconds Final  . WBC 08/08/2013 6.3  4.0 - 10.5 K/uL Final  . RBC 08/08/2013 4.97  3.87 - 5.11 MIL/uL Final  . Hemoglobin 08/08/2013 15.0  12.0 - 15.0 g/dL Final  . HCT 08/08/2013 44.5  36.0 - 46.0 % Final  . MCV 08/08/2013 89.5  78.0 - 100.0 fL Final  . MCH 08/08/2013 30.2  26.0 - 34.0 pg Final  . MCHC 08/08/2013 33.7  30.0 - 36.0 g/dL Final  . RDW 08/08/2013 12.6  11.5 - 15.5 % Final  . Platelets 08/08/2013 163  150 - 400 K/uL Final  . Sodium 08/08/2013 140  137 - 147 mEq/L Final  . Potassium 08/08/2013 4.2  3.7 - 5.3 mEq/L Final  . Chloride 08/08/2013 100  96 - 112 mEq/L Final  . CO2 08/08/2013 28  19 - 32 mEq/L Final  . Glucose, Bld 08/08/2013 69* 70 - 99 mg/dL Final  . BUN 08/08/2013 10  6 - 23 mg/dL Final  . Creatinine,  Ser 08/08/2013 0.75  0.50 - 1.10 mg/dL Final  . Calcium 08/08/2013 9.8  8.4 - 10.5 mg/dL Final  . Total Protein 08/08/2013 6.9  6.0 - 8.3 g/dL Final  . Albumin 08/08/2013 4.0  3.5 - 5.2 g/dL Final  . AST 08/08/2013 16  0 - 37 U/L Final  . ALT 08/08/2013 11  0 - 35 U/L Final  . Alkaline Phosphatase 08/08/2013 74  39 - 117 U/L Final  . Total Bilirubin 08/08/2013 1.0  0.3 - 1.2 mg/dL Final  . GFR calc non Af Amer 08/08/2013 85* >90 mL/min Final  . GFR calc Af Amer 08/08/2013 >90  >90 mL/min Final   Comment: (NOTE)                          The eGFR has been calculated using the CKD EPI equation.                          This calculation has not been validated in all clinical situations.  eGFR's persistently <90 mL/min signify possible Chronic Kidney                          Disease.  Marland Kitchen Prothrombin Time 08/08/2013 13.1  11.6 - 15.2 seconds Final  . INR 08/08/2013 1.01  0.00 - 1.49 Final  . ABO/RH(D) 08/08/2013 B POS   Final  . Antibody Screen 08/08/2013 NEG   Final  . Sample Expiration 08/08/2013 08/22/2013   Final  . Color, Urine 08/08/2013 YELLOW  YELLOW Final  . APPearance 08/08/2013 CLEAR  CLEAR Final  . Specific Gravity, Urine 08/08/2013 1.008  1.005 - 1.030 Final  . pH 08/08/2013 7.0  5.0 - 8.0 Final  . Glucose, UA 08/08/2013 NEGATIVE  NEGATIVE mg/dL Final  . Hgb urine dipstick 08/08/2013 NEGATIVE  NEGATIVE Final  . Bilirubin Urine 08/08/2013 NEGATIVE  NEGATIVE Final  . Ketones, ur 08/08/2013 NEGATIVE  NEGATIVE mg/dL Final  . Protein, ur 27/81/0860 NEGATIVE  NEGATIVE mg/dL Final  . Urobilinogen, UA 08/08/2013 0.2  0.0 - 1.0 mg/dL Final  . Nitrite 76/37/7649 NEGATIVE  NEGATIVE Final  . Leukocytes, UA 08/08/2013 NEGATIVE  NEGATIVE Final   MICROSCOPIC NOT DONE ON URINES WITH NEGATIVE PROTEIN, BLOOD, LEUKOCYTES, NITRITE, OR GLUCOSE <1000 mg/dL.  . ABO/RH(D) 08/08/2013 B POS   Final     X-Rays:Dg Chest 2 View  08/08/2013   CLINICAL DATA:  Preoperative  evaluation for left hip surgery, history breast cancer, former smoker  EXAM: CHEST  2 VIEW  COMPARISON:  08/29/2009  FINDINGS: Normal heart size, mediastinal contours, and pulmonary vascularity.  Bronchitic and minimal emphysematous changes.  No acute infiltrate, pleural effusion, or pneumothorax.  Bones demineralized with biconvex thoracolumbar scoliosis.  Surgical clips right axilla question prior lymph node dissection.  IMPRESSION: Bronchitic and minimal emphysematous changes raising question of COPD.  No acute abnormalities.   Electronically Signed   By: Ulyses Southward M.D.   On: 08/08/2013 10:29   Dg Hip Complete Left  08/08/2013   CLINICAL DATA:  Osteoarthritis left hip, preoperative evaluation  EXAM: LEFT HIP - COMPLETE 2+ VIEW  COMPARISON:  None  FINDINGS: Diffuse osseous demineralization.  Advanced osteoarthritic changes of the left hip joint with joint space narrowing, mild spur formation and subchondral cyst formation.  SI joints symmetric.  Right hip joint space preserved.  No acute fracture, dislocation, or additional bone destruction.  IMPRESSION: Osteoarthritic changes left hip joint.  Osseous demineralization.   Electronically Signed   By: Ulyses Southward M.D.   On: 08/08/2013 10:29   Dg Pelvis Portable  08/18/2013   CLINICAL DATA:  Total left hip replacement.  EXAM: PORTABLE PELVIS 1-2 VIEWS  COMPARISON:  None.  FINDINGS: Total left hip replacement. Good anatomic alignment on AP view. No acute bony abnormality.  IMPRESSION: Total left hip replacement with good anatomic alignment.   Electronically Signed   By: Maisie Fus  Register   On: 08/18/2013 15:30   Dg C-arm 1-60 Min-no Report  08/18/2013   CLINICAL DATA: left anterior total hip arthroplasty   C-ARM 1-60 MINUTES  Fluoroscopy was utilized by the requesting physician.  No radiographic  interpretation.     EKG: Orders placed during the hospital encounter of 08/08/13  . EKG 12-LEAD  . EKG 12-LEAD     Hospital Course: Patient was admitted to  [facility] and taken to the OR and underwent the above state procedure without complications.  Patient tolerated the procedure well and was later transferred to the recovery room  and then to the orthopaedic floor for postoperative care.  They were given PO and IV analgesics for pain control following their surgery.  They were given 24 hours of postoperative antibiotics of  Anti-infectives   Start     Dose/Rate Route Frequency Ordered Stop   08/18/13 2030  ceFAZolin (ANCEF) IVPB 1 g/50 mL premix     1 g 100 mL/hr over 30 Minutes Intravenous Every 6 hours 08/18/13 1641 08/19/13 0236   08/18/13 0600  ceFAZolin (ANCEF) IVPB 2 g/50 mL premix     2 g 100 mL/hr over 30 Minutes Intravenous On call to O.R. 08/17/13 1405 08/18/13 1327     and started on DVT prophylaxis in the form of Xarelto.   PT and OT were ordered for total hip protocol.  The patient was allowed to be WBAT with therapy. Discharge planning was consulted to help with postop disposition and equipment needs.  Patient had a decent night on the evening of surgery.  They started to get up OOB with therapy on day one.  Hemovac drain was pulled without difficulty.  Continued to work with therapy into day two.  Dressing was changed on day two and the incision was healing well.  By day three, the patient had progressed with therapy and meeting their goals.  Incision was healing well.  Patient was seen in rounds and was ready to go [facility].  Discharge to SNF  Diet - Regular diet  Follow up - in 2 weeks  Activity - WBAT  Disposition - Skilled nursing facility  Condition Upon Discharge - Good  D/C Meds - See DC Summary  DVT Prophylaxis - Xarelto   Discharge Orders   Future Appointments Provider Department Dept Phone   12/13/2013 8:30 AM [facility] [phone]   12/13/2013 9:00 AM Victorino December, MD [facility] Oncology [phone]   Future Orders Complete By  Expires   Call MD / Call 911  As directed    Change dressing  As directed    Constipation Prevention  As directed    Diet general  As directed    Discharge instructions  As directed    Do not sit on low chairs, stoools or toilet seats, as it may be difficult to get up from low surfaces  As directed    Driving restrictions  As directed    Increase activity slowly as tolerated  As directed    Lifting restrictions  As directed    Patient may shower  As directed    TED hose  As directed    Weight bearing as tolerated  As directed    Questions:     Laterality:     Extremity:         Medication List    STOP taking these medications       cholecalciferol 1000 UNITS tablet  Commonly known as:  VITAMIN D     OVER THE COUNTER MEDICATION      TAKE these medications       acetaminophen 325 MG tablet  Commonly known as:  TYLENOL  Take 2 tablets (650 mg total) by mouth every 6 (six) hours as needed for mild pain (or Fever >/= 101).     anastrozole 1 MG tablet  Commonly known as:  ARIMIDEX  Take 1 mg by mouth daily.     DSS 100 MG Caps  Take 100 mg by mouth 2 (two) times daily.  methocarbamol 500 MG tablet  Commonly known as:  ROBAXIN  Take 1 tablet (500 mg total) by mouth every 6 (six) hours as needed for muscle spasms.     metoCLOPramide 5 MG tablet  Commonly known as:  REGLAN  Take 1-2 tablets (5-10 mg total) by mouth every 8 (eight) hours as needed for nausea (if ondansetron (ZOFRAN) ineffective.).     ondansetron 4 MG tablet  Commonly known as:  ZOFRAN  Take 1 tablet (4 mg total) by mouth every 6 (six) hours as needed for nausea.     oxyCODONE 5 MG immediate release tablet  Commonly known as:  Oxy IR/ROXICODONE  Take 1-2 tablets (5-10 mg total) by mouth every 3 (three) hours as needed for moderate pain, severe pain or breakthrough pain.     polyethylene glycol packet  Commonly known as:  MIRALAX / GLYCOLAX  Take 17 g by mouth daily as needed for mild constipation.      rivaroxaban 10 MG Tabs tablet  Commonly known as:  XARELTO  - Take 1 tablet (10 mg total) by mouth daily with breakfast. Take Xarelto for two and a half more weeks, then discontinue Xarelto.  - Once the patient has completed the blood thinner regimen, then take a Baby 81 mg Aspirin daily for three more weeks.     traMADol 50 MG tablet  Commonly known as:  ULTRAM  Take 1-2 tablets (50-100 mg total) by mouth every 6 (six) hours as needed (mild to moderate).           Follow-up Information   Follow up with Gearlean Alf, MD. Schedule an appointment as soon as possible for a visit on 08/29/2013. (Call [phone] tomorrow to make the appointment)    Specialty:  Orthopedic Surgery   Contact information:   [address] [place] [address] [phone]       Signed: Mickel Crow 08/21/2013, 7:05 AM

## 2013-08-21 NOTE — Progress Notes (Signed)
Patient discharged to [facility] SN[facility]. Report called to [place], RN at [facility]. IV was removed and patient left unit in a stable condition with all personal belongings via EMS.

## 2013-08-21 NOTE — Progress Notes (Signed)
Seen and agreed 08/21/2013 Julia Elizabeth Robinette PTA [phone] pager [phone] office    

## 2013-08-21 NOTE — Progress Notes (Signed)
Clinical social worker assisted with patient discharge to skilled nursing facility, [facility].  CSW addressed all family questions and concerns. CSW copied chart and added all important documents. CSW also set up patient transportation with [facility]. Clinical Social Worker will sign off for now as social work intervention is no longer needed.   Cardarius Senat, MSW, LCSWA [phone] 

## 2013-08-21 NOTE — Care Management Note (Signed)
CARE MANAGEMENT NOTE 08/21/2013  Patient:  LASHAWNE, DURA   Account Number:  [id number]  Date Initiated:  08/19/2013  Documentation initiated by:  Ricki Miller  Subjective/Objective Assessment:   68 yr old female s/p left total hip arthroplasty.     Action/Plan:   Case manager spoke with patient concerning discharge plans. She states she will be going to [facility]. CM notified Education officer, museum.   Anticipated DC Date:  08/21/2013   Anticipated DC Plan:  SKILLED NURSING FACILITY  In-house referral  Clinical Social Worker      DC Planning Services  CM consult      Choice offered to / List presented to:             Status of service:  Completed, signed off Medicare Important Message given?   (If response is "NO", the following Medicare IM given date fields will be blank) Date Medicare IM given:   Date Additional Medicare IM given:    Discharge Disposition:  SKILLED NURSING FACILITY  Per UR Regulation:    If discussed at Long Length of Stay Meetings, dates discussed:    Comments:

## 2013-08-21 NOTE — Progress Notes (Signed)
Physical Therapy Treatment Patient Details Name: Sarah Lester MRN: [id number] DOB: 12/08/1945 Today's Date: 08/21/2013    History of Present Illness left direct anterior THA    PT Comments    Patient is progressing towards goals with increased gait training. Patient is motivated for D/C to SNF today. Patient would benefit from increased skilled interventions to help with strengthening. Continue plan of care at this time.  Follow Up Recommendations  SNF     Equipment Recommendations  None recommended by PT    Recommendations for Other Services       Precautions / Restrictions Precautions Precaution Comments: direct anterior Restrictions LLE Weight Bearing: Weight bearing as tolerated    Mobility  Bed Mobility Overal bed mobility: Needs Assistance Bed Mobility: Supine to Sit;Sit to Supine     Supine to sit: Supervision Sit to supine: Supervision   General bed mobility comments: patient able to demonstrate supine to sit with good technique. Cues required for sit to supine.  Transfers Overall transfer level: Needs assistance Equipment used: Rolling walker (2 wheeled) Transfers: Sit to/from Stand Sit to Stand: Supervision         General transfer comment: patient able to perform transfer x 3 with good technique.  Ambulation/Gait Ambulation/Gait assistance: Supervision Ambulation Distance (Feet): 700 Feet Assistive device: Rolling walker (2 wheeled) Gait Pattern/deviations: Step-through pattern;Decreased stride length     General Gait Details: patient required cues to stay in RW and relax shoulders with gait training.    Stairs            Wheelchair Mobility    Modified Rankin (Stroke Patients Only)       Balance                                    Cognition Arousal/Alertness: Awake/alert Behavior During Therapy: WFL for tasks assessed/performed Overall Cognitive Status: Within Functional Limits for tasks assessed                       Exercises      General Comments        Pertinent Vitals/Pain Patient reports discomfort near incision, not rated. Patient positioned in bed for comfort.    Home Living                      Prior Function            PT Goals (current goals can now be found in the care plan section) Progress towards PT goals: Progressing toward goals    Frequency  7X/week    PT Plan Current plan remains appropriate    Co-evaluation             End of Session Equipment Utilized During Treatment: Gait belt Activity Tolerance: Patient tolerated treatment well Patient left: in bed;with call bell/phone within reach     Time: 1007-1028 PT Time Calculation (min): 21 min  Charges:                       G Codes:      [place], SPTA 08/21/2013, 10:39 AM

## 2013-08-21 NOTE — Progress Notes (Signed)
   Subjective: 3 Days Post-Op Procedure(s) (LRB): LEFT TOTAL HIP ARTHROPLASTY ANTERIOR APPROACH (Left) Patient reports pain as mild.   Patient seen in rounds for Dr. Wynelle Link. Patient is well, but has had some minor complaints of pain in the hip, requiring pain medications Patient is ready to go to rehab at [facility]  Objective: Vital signs in last 24 hours: Temp:  [98.8 F (37.1 C)-100.2 F (37.9 C)] 100.1 F (37.8 C) (04/27 0609) Pulse Rate:  [77-88] 79 (04/27 0609) Resp:  [20] 20 (04/27 0609) BP: (91-98)/(32-49) 93/49 mmHg (04/27 0609) SpO2:  [96 %-100 %] 96 % (04/27 0609)  Intake/Output from previous day:  Intake/Output Summary (Last 24 hours) at 08/21/13 0659 Last data filed at 08/20/13 2000  Gross per 24 hour  Intake    720 ml  Output      0 ml  Net    720 ml    Intake/Output this shift: Total I/O In: 240 [P.O.:240] Out: -   Labs:  Recent Labs  08/19/13 0705 08/20/13 0430 08/21/13 0602  HGB 11.0* 10.2* 10.9*    Recent Labs  08/20/13 0430 08/21/13 0602  WBC 10.4 8.2  RBC 3.36* 3.68*  HCT 30.2* 33.0*  PLT 99* 109*    Recent Labs  08/19/13 0705 08/20/13 0430  NA 136* 142  K 4.1 3.7  CL 100 106  CO2 23 26  BUN 11 9  CREATININE 0.64 0.66  GLUCOSE 143* 106*  CALCIUM 8.8 8.5   No results found for this basename: LABPT, INR,  in the last 72 hours  EXAM: General - Patient is Alert, Appropriate and Oriented Extremity - Neurovascular intact Sensation intact distally Incision - clean, dry, no drainage Motor Function - intact, moving foot and toes well on exam.   Assessment/Plan: 3 Days Post-Op Procedure(s) (LRB): LEFT TOTAL HIP ARTHROPLASTY ANTERIOR APPROACH (Left) Procedure(s) (LRB): LEFT TOTAL HIP ARTHROPLASTY ANTERIOR APPROACH (Left) Past Medical History  Diagnosis Date  . 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 Complication of anesthesia     long time to wake up  . PONV (postoperative nausea and vomiting)   . Arthritis    Principal  Problem:   OA (osteoarthritis) of hip  Estimated body mass index is 22.5 kg/(m^2) as calculated from the following:   Height as of this encounter: 5\' 3"  (1.6 m).   Weight as of this encounter: 57.607 kg (127 lb). Up with therapy Discharge to SNF Diet - Regular diet Follow up - in 2 weeks Activity - WBAT Disposition - Skilled nursing facility Condition Upon Discharge - Good D/C Meds - See DC Summary DVT Prophylaxis - Xarelto  Neeta Storey 08/21/2013, 6:59 AM

## 2013-08-22 ENCOUNTER — Encounter (HOSPITAL_COMMUNITY): Payer: Self-pay | Admitting: Orthopedic Surgery

## 2013-08-22 ENCOUNTER — Encounter: Payer: Self-pay | Admitting: *Deleted

## 2013-08-24 ENCOUNTER — Encounter: Payer: Self-pay | Admitting: Adult Health

## 2013-08-24 ENCOUNTER — Non-Acute Institutional Stay (SKILLED_NURSING_FACILITY): Payer: Commercial Managed Care - HMO | Admitting: Adult Health

## 2013-08-24 DIAGNOSIS — C50919 Malignant neoplasm of unspecified site of unspecified female breast: Secondary | ICD-10-CM

## 2013-08-24 DIAGNOSIS — K5900 Constipation, unspecified: Secondary | ICD-10-CM

## 2013-08-24 DIAGNOSIS — M1610 Unilateral primary osteoarthritis, unspecified hip: Secondary | ICD-10-CM

## 2013-08-24 DIAGNOSIS — M169 Osteoarthritis of hip, unspecified: Secondary | ICD-10-CM

## 2013-08-24 DIAGNOSIS — D62 Acute posthemorrhagic anemia: Secondary | ICD-10-CM | POA: Insufficient documentation

## 2013-08-24 NOTE — Progress Notes (Addendum)
Patient ID: Sarah Lester, female   DOB: Dec 30, 1945, 68 y.o.   MRN: [id number]               PROGRESS NOTE  DATE: 08/24/2013  FACILITY: Nursing Home Location: [facility]  LEVEL OF CARE: SNF (31)  Acute Visit  CHIEF COMPLAINT: Follow-up Hospitalization  HISTORY OF PRESENT ILLNESS: This is a 68 year old female who has been admitted to [facility] on 08/21/13 from [facility] with Osteoarthritis S/P left total hip arthroplasty. She has been admitted for a short-term rehabilitation.  REASSESSMENT OF ONGOING PROBLEM(S):  CONSTIPATION: The constipation remains stable. No complications from the medications presently being used. Patient denies ongoing constipation, abdominal pain, nausea or vomiting.  ANEMIA: The anemia has been stable. The patient denies fatigue, melena or hematochezia. No complications from the medications currently being used.  4/15 hgb 8.2   PAST MEDICAL HISTORY : Reviewed.  No changes/see problem list  CURRENT MEDICATIONS: Reviewed per MAR/see medication list  REVIEW OF SYSTEMS:  GENERAL: no change in appetite, no fatigue, no weight changes, no fever, chills or weakness RESPIRATORY: no cough, SOB, DOE, wheezing, hemoptysis CARDIAC: no chest pain, edema or palpitations GI: no abdominal pain, diarrhea, constipation, heart burn, nausea or vomiting  PHYSICAL EXAMINATION  GENERAL: no acute distress, normal body habitus EYES: conjunctivae normal, sclerae normal, normal eye lids NECK: supple, trachea midline, no neck masses, no thyroid tenderness, no thyromegaly LYMPHATICS: no LAN in the neck, no supraclavicular LAN RESPIRATORY: breathing is even & unlabored, BS CTAB CARDIAC: RRR, no murmur,no extra heart sounds, no edema GI: abdomen soft, normal BS, no masses, no tenderness, no hepatomegaly, no splenomegaly EXTREMITIES:  Able to move all 4 extremities PSYCHIATRIC: the patient is alert & oriented to person, affect & behavior  appropriate  LABS/RADIOLOGY: Labs reviewed: Basic Metabolic Panel:  Recent Labs  08/08/13 0950 08/19/13 0705 08/20/13 0430  NA 140 136* 142  K 4.2 4.1 3.7  CL 100 100 106  CO2 28 23 26   GLUCOSE 69* 143* 106*  BUN 10 11 9   CREATININE 0.75 0.64 0.66  CALCIUM 9.8 8.8 8.5   Liver Function Tests:  Recent Labs  11/30/12 0843 06/02/13 0849 08/08/13 0950  AST 12 14 16   ALT 9 11 11   ALKPHOS 65 70 74  BILITOT 1.64* 1.18 1.0  PROT 6.4 6.3* 6.9  ALBUMIN 3.6 4.0 4.0   CBC:  Recent Labs  11/30/12 0843 06/02/13 0849  08/19/13 0705 08/20/13 0430 08/21/13 0602  WBC 6.9 4.9  < > 13.2* 10.4 8.2  NEUTROABS 4.5 2.7  --   --   --   --   HGB 14.6 14.5  < > 11.0* 10.2* 10.9*  HCT 43.1 43.4  < > 33.1* 30.2* 33.0*  MCV 90.2 89.2  < > 88.7 89.9 89.7  PLT 152 150  < > 141* 99* 109*  < > = values in this interval not displayed.   ASSESSMENT/PLAN:  Osteoarthritis status post left total hip arthroplasty - for rehabilitation  Constipation - continue Colace and MiraLax  History of breast cancer - continue Arimidex  Anemia, acute blood loss - check CBC   CPT CODE: 24401  Sarah Lester - NP [facility] [phone]

## 2013-08-29 ENCOUNTER — Other Ambulatory Visit: Payer: Self-pay | Admitting: *Deleted

## 2013-08-29 MED ORDER — OXYCODONE HCL 5 MG PO TABS: ORAL_TABLET | ORAL | Status: DC

## 2013-08-29 NOTE — Telephone Encounter (Signed)
[facility] 

## 2013-09-01 DIAGNOSIS — Z4789 Encounter for other orthopedic aftercare: Secondary | ICD-10-CM

## 2013-09-01 DIAGNOSIS — M25559 Pain in unspecified hip: Secondary | ICD-10-CM

## 2013-11-22 ENCOUNTER — Telehealth: Payer: Self-pay | Admitting: *Deleted

## 2013-11-22 NOTE — Telephone Encounter (Signed)
Pt called stating that she found out that Dr. Humphrey Rolls is no longer here and wants to see a physician not a NP.  Confirmed 12/14/13 appt w/ pt.

## 2013-12-13 ENCOUNTER — Other Ambulatory Visit: Payer: Self-pay

## 2013-12-13 ENCOUNTER — Ambulatory Visit: Payer: Medicare HMO | Admitting: Oncology

## 2013-12-13 ENCOUNTER — Other Ambulatory Visit: Payer: Medicare HMO

## 2013-12-13 DIAGNOSIS — C50919 Malignant neoplasm of unspecified site of unspecified female breast: Secondary | ICD-10-CM

## 2013-12-14 ENCOUNTER — Other Ambulatory Visit (HOSPITAL_BASED_OUTPATIENT_CLINIC_OR_DEPARTMENT_OTHER): Payer: Medicare HMO

## 2013-12-14 ENCOUNTER — Ambulatory Visit (HOSPITAL_BASED_OUTPATIENT_CLINIC_OR_DEPARTMENT_OTHER): Payer: Medicare HMO | Admitting: Hematology and Oncology

## 2013-12-14 ENCOUNTER — Telehealth: Payer: Self-pay | Admitting: Hematology and Oncology

## 2013-12-14 ENCOUNTER — Encounter: Payer: Self-pay | Admitting: Hematology and Oncology

## 2013-12-14 VITALS — BP 116/66 | HR 72 | Temp 98.3°F | Resp 18 | Ht 63.0 in | Wt 127.2 lb

## 2013-12-14 DIAGNOSIS — C50911 Malignant neoplasm of unspecified site of right female breast: Secondary | ICD-10-CM

## 2013-12-14 DIAGNOSIS — C50919 Malignant neoplasm of unspecified site of unspecified female breast: Secondary | ICD-10-CM

## 2013-12-14 DIAGNOSIS — C50619 Malignant neoplasm of axillary tail of unspecified female breast: Secondary | ICD-10-CM

## 2013-12-14 DIAGNOSIS — Z170 Estrogen receptor positive status [ER+]: Secondary | ICD-10-CM

## 2013-12-14 LAB — CBC WITH DIFFERENTIAL/PLATELET
BASO%: 1.1 % (ref 0.0–2.0)
BASOS ABS: 0.1 10*3/uL (ref 0.0–0.1)
EOS%: 1.9 % (ref 0.0–7.0)
Eosinophils Absolute: 0.1 10*3/uL (ref 0.0–0.5)
HEMATOCRIT: 45.1 % (ref 34.8–46.6)
HGB: 14.6 g/dL (ref 11.6–15.9)
LYMPH#: 1.9 10*3/uL (ref 0.9–3.3)
LYMPH%: 31.8 % (ref 14.0–49.7)
MCH: 28.6 pg (ref 25.1–34.0)
MCHC: 32.4 g/dL (ref 31.5–36.0)
MCV: 88.2 fL (ref 79.5–101.0)
MONO#: 0.5 10*3/uL (ref 0.1–0.9)
MONO%: 8.4 % (ref 0.0–14.0)
NEUT#: 3.4 10*3/uL (ref 1.5–6.5)
NEUT%: 56.8 % (ref 38.4–76.8)
Platelets: 198 10*3/uL (ref 145–400)
RBC: 5.12 10*6/uL (ref 3.70–5.45)
RDW: 13.4 % (ref 11.2–14.5)
WBC: 5.9 10*3/uL (ref 3.9–10.3)

## 2013-12-14 LAB — COMPREHENSIVE METABOLIC PANEL (CC13)
ALT: 9 U/L (ref 0–55)
AST: 15 U/L (ref 5–34)
Albumin: 3.9 g/dL (ref 3.5–5.0)
Alkaline Phosphatase: 74 U/L (ref 40–150)
Anion Gap: 10 mEq/L (ref 3–11)
BUN: 10.1 mg/dL (ref 7.0–26.0)
CHLORIDE: 104 meq/L (ref 98–109)
CO2: 28 mEq/L (ref 22–29)
CREATININE: 0.8 mg/dL (ref 0.6–1.1)
Calcium: 9.9 mg/dL (ref 8.4–10.4)
Glucose: 86 mg/dl (ref 70–140)
Potassium: 4 mEq/L (ref 3.5–5.1)
Sodium: 141 mEq/L (ref 136–145)
Total Bilirubin: 0.75 mg/dL (ref 0.20–1.20)
Total Protein: 7 g/dL (ref 6.4–8.3)

## 2013-12-14 NOTE — Progress Notes (Signed)
Patient Care Team: Tommy Medal, MD as PCP - General (Internal Medicine)  DIAGNOSIS: Breast CA   Primary site: Breast (Right)   Staging method: AJCC 7th Edition   Pathologic: Stage IIA (T1c, N1, cM0) signed by Rulon Eisenmenger, MD on 12/14/2013  1:41 PM   Summary: Stage IIA (T1c, N1, cM0)   SUMMARY OF ONCOLOGIC HISTORY:   Breast CA   08/26/2009 Initial Diagnosis Breast CA: 1.0 cm grade 1 invasive ductal carcinoma 2/8 lymph nodes positive   08/31/2009 Surgery Right breast lumpectomy and axillary node dissection with concurrent left retroareolar excisional biopsy of benign ductal ectasia T1 C. N1 M0 invasive ductal carcinoma: 2/8 LN positive ER 97% PR 81% Ki-67 10% HER-2 negative ratio 1.29   08/31/2009 Procedure Oncotype DX recurrence score 13 with -5 year risk of recurrence of 8%. Did not receive chemotherapy   11/14/2009 - 12/25/2009 Radiation Therapy Radiation therapy to lumpectomy site   12/26/2009 -  Anti-estrogen oral therapy Arimidex 1 mg by mouth daily    CHIEF COMPLIANT:  Annual followup of breast cancer  INTERVAL HISTORY:  Sarah Lester is a 68 year old Caucasian lady with the above-mentioned history of stage II breast cancer. She is currently on adjuvant hormonal therapy with Arimidex since September 2011. She is doing extremely well without any major problems or concerns. She had a recent mammogram in June and is here today for a physical exam and review of the mammogram results. She denies any lumps bumps or nodules in the breasts. She had recent hip replacement surgery on the left and since then she is doing more physical activity. She do and is very conscious about her lifestyle. She does not take many medications except for a couple of supplements. Her She does yoga and exercise and watches what she eats.   REVIEW OF SYSTEMS:   Constitutional: Denies fevers, chills or abnormal weight loss Eyes: Denies blurriness of vision Ears, nose, mouth, throat, and face: Denies mucositis or sore  throat Respiratory: Denies cough, dyspnea or wheezes Cardiovascular: Denies palpitation, chest discomfort or lower extremity swelling Gastrointestinal:  Denies nausea, heartburn or change in bowel habits Skin: Denies abnormal skin rashes Lymphatics: Denies new lymphadenopathy or easy bruising Neurological:Denies numbness, tingling or new weaknesses Behavioral/Psych: Mood is stable, no new changes  Breast: Denies any lumps or nodules  Denies any pain. All other systems were reviewed with the patient and are negative.  I have reviewed the past medical history, past surgical history, social history and family history with the patient and they are unchanged from previous note.  ALLERGIES:  has No Known Allergies.  MEDICATIONS:  Current Outpatient Prescriptions  Medication Sig Dispense Refill  . anastrozole (ARIMIDEX) 1 MG tablet Take 1 mg by mouth daily.      . cholecalciferol (VITAMIN D) 1000 UNITS tablet Take 1,000 Units by mouth daily.      . Coral Calcium 500 MG CAPS Take by mouth.       No current facility-administered medications for this visit.    PHYSICAL EXAMINATION: ECOG PERFORMANCE STATUS: 0 - Asymptomatic  Filed Vitals:   12/14/13 1301  BP: 116/66  Pulse: 72  Temp: 98.3 F (36.8 C)  Resp: 18   Filed Weights   12/14/13 1301  Weight: 127 lb 3.2 oz (57.698 kg)    GENERAL:alert, no distress and comfortable SKIN: skin color, texture, turgor are normal, no rashes or significant lesions EYES: normal, Conjunctiva are pink and non-injected, sclera clear OROPHARYNX:no exudate, no erythema and lips, buccal mucosa, and tongue  normal  NECK: supple, thyroid normal size, non-tender, without nodularity LYMPH:  no palpable lymphadenopathy in the cervical, axillary or inguinal LUNGS: clear to auscultation and percussion with normal breathing effort HEART: regular rate & rhythm and no murmurs and no lower extremity edema ABDOMEN:abdomen soft, non-tender and normal bowel  sounds Musculoskeletal:no cyanosis of digits and no clubbing  NEURO: alert & oriented x 3 with fluent speech, no focal motor/sensory deficits BREAST: No palpable masses lungs or nodules in either right or left breasts. No palpable axillary supraclavicular or infraclavicular adenopathy no breast tenderness or nipple discharge.   LABORATORY DATA:  I have reviewed the data as listed Appointment on 12/14/2013  Component Date Value Ref Range Status  . WBC 12/14/2013 5.9  3.9 - 10.3 10e3/uL Final  . NEUT# 12/14/2013 3.4  1.5 - 6.5 10e3/uL Final  . HGB 12/14/2013 14.6  11.6 - 15.9 g/dL Final  . HCT 12/14/2013 45.1  34.8 - 46.6 % Final  . Platelets 12/14/2013 198  145 - 400 10e3/uL Final  . MCV 12/14/2013 88.2  79.5 - 101.0 fL Final  . MCH 12/14/2013 28.6  25.1 - 34.0 pg Final  . MCHC 12/14/2013 32.4  31.5 - 36.0 g/dL Final  . RBC 12/14/2013 5.12  3.70 - 5.45 10e6/uL Final  . RDW 12/14/2013 13.4  11.2 - 14.5 % Final  . lymph# 12/14/2013 1.9  0.9 - 3.3 10e3/uL Final  . MONO# 12/14/2013 0.5  0.1 - 0.9 10e3/uL Final  . Eosinophils Absolute 12/14/2013 0.1  0.0 - 0.5 10e3/uL Final  . Basophils Absolute 12/14/2013 0.1  0.0 - 0.1 10e3/uL Final  . NEUT% 12/14/2013 56.8  38.4 - 76.8 % Final  . LYMPH% 12/14/2013 31.8  14.0 - 49.7 % Final  . MONO% 12/14/2013 8.4  0.0 - 14.0 % Final  . EOS% 12/14/2013 1.9  0.0 - 7.0 % Final  . BASO% 12/14/2013 1.1  0.0 - 2.0 % Final  . Sodium 12/14/2013 141  136 - 145 mEq/L Final  . Potassium 12/14/2013 4.0  3.5 - 5.1 mEq/L Final  . Chloride 12/14/2013 104  98 - 109 mEq/L Final  . CO2 12/14/2013 28  22 - 29 mEq/L Final  . Glucose 12/14/2013 86  70 - 140 mg/dl Final  . BUN 12/14/2013 10.1  7.0 - 26.0 mg/dL Final  . Creatinine 12/14/2013 0.8  0.6 - 1.1 mg/dL Final  . Total Bilirubin 12/14/2013 0.75  0.20 - 1.20 mg/dL Final  . Alkaline Phosphatase 12/14/2013 74  40 - 150 U/L Final  . AST 12/14/2013 15  5 - 34 U/L Final  . ALT 12/14/2013 9  0 - 55 U/L Final  . Total  Protein 12/14/2013 7.0  6.4 - 8.3 g/dL Final  . Albumin 12/14/2013 3.9  3.5 - 5.0 g/dL Final  . Calcium 12/14/2013 9.9  8.4 - 10.4 mg/dL Final  . Anion Gap 12/14/2013 10  3 - 11 mEq/L Final    RADIOGRAPHIC STUDIES: Mammogram June 2015: No evidence of any mammographic abnormalities 1 year followup recommended.  ASSESSMENT & PLAN:  Breast CA 1. 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 ER 97% PR 81% Ki-67 10% HER-2 negative ratio at 1.29. Status post lumpectomy with 2 positive lymph nodes out of 8. Patient had low risk Oncotype DX score and after counseling she did not receive adjuvant chemotherapy. Patient is on annual surveillance with mammograms and followups. She had a mammogram in June 2015 and the results were discussed which were normal. She  did not have any palpable nodules or lumps of any abnormalities on physical exam.  2. Patient wanted to know if she needs to continue Arimidex beyond 5 years. I discussed the results of long-term tamoxifen study which showed 10 units was a period of 5 years. There is no current data on long-term aromatase inhibitors. Patient wished to stop treatment after 5. We need to discuss this next year whether continuation beyond 5 years is of benefit. She is tolerating Arimidex extremely well without any major problems.  3. Survivorship: Patient is very active does yoga physical exercise and watches what she needs a very active lifestyle. She recently had a hip replacement and since then she is doing much better. She sees GYN for her Pap smears. She will need a bone density test to look for osteoporosis before next visit.   Orders Placed This Encounter  Procedures  . MM Digital Diagnostic Bilat    Standing Status: Future     Number of Occurrences:      Standing Expiration Date: 12/14/2014    Order Specific Question:  Reason for Exam (SYMPTOM  OR DIAGNOSIS REQUIRED)    Answer:  H/O breast cancer    Order Specific Question:  Preferred  imaging location?    Answer:  [facility]  . DG Bone Density    Standing Status: Future     Number of Occurrences:      Standing Expiration Date: 12/14/2014    Order Specific Question:  Reason for Exam (SYMPTOM  OR DIAGNOSIS REQUIRED)    Answer:  Currently on aromatase inhibitor therapy postmenopausal evaluate for osteoporosis    Order Specific Question:  Preferred imaging location?    Answer:  [facility]  . CBC with Differential    Standing Status: Future     Number of Occurrences:      Standing Expiration Date: 12/14/2014  . Comprehensive metabolic panel (Cmet) - [facility]    Standing Status: Future     Number of Occurrences:      Standing Expiration Date: 12/14/2014   The patient has a good understanding of the overall plan. she agrees with it. She will call with any problems that may develop before her next visit here.  I spent 25 minutes counseling the patient face to face. The total time spent in the appointment was 30 minutes and more than 50% was on counseling and review of test results    Rulon Eisenmenger, MD 12/14/2013 1:47 PM

## 2013-12-14 NOTE — Assessment & Plan Note (Signed)
1. 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 ER 97% PR 81% Ki-67 10% HER-2 negative ratio at 1.29. Status post lumpectomy with 2 positive lymph nodes out of 8. Patient had low risk Oncotype DX score and after counseling she did not receive adjuvant chemotherapy. Patient is on annual surveillance with mammograms and followups. She had a mammogram in June 2015 and the results were discussed which were normal. She did not have any palpable nodules or lumps of any abnormalities on physical exam.  2. Patient wanted to know if she needs to continue Arimidex beyond 5 years. I discussed the results of long-term tamoxifen study which showed 10 units was a period of 5 years. There is no current data on long-term aromatase inhibitors. Patient wished to stop treatment after 5. We need to discuss this next year whether continuation beyond 5 years is of benefit. She is tolerating Arimidex extremely well without any major problems.  3. Survivorship: Patient is very active does yoga physical exercise and watches what she needs a very active lifestyle. She recently had a hip replacement and since then she is doing much better. She sees GYN for her Pap smears. She will need a bone density test to look for osteoporosis before next visit.

## 2013-12-14 NOTE — Progress Notes (Signed)
Received mammogram by fax from [place] 10/09/13 Dr. Luan Pulling.  Copy to Dr. Lindi Adie.  Original to scan.

## 2013-12-14 NOTE — Telephone Encounter (Signed)
, °

## 2014-05-28 ENCOUNTER — Telehealth: Payer: Self-pay | Admitting: *Deleted

## 2014-05-28 NOTE — Telephone Encounter (Signed)
Patient called requesting email address for Dr. Lindi Adie to discuss arimidex. Called patient and she states that her 5 years on Arimidex will be up in June and wants to stop taking it now. States that the hot flashes are intolerable and she is having some numbness in her hands. Informed patient that I could not give out his email address but would share her concerns with Dr. Lindi Adie and would give her a return call. Patient verbalized understanding.

## 2014-05-29 ENCOUNTER — Telehealth: Payer: Self-pay | Admitting: *Deleted

## 2014-05-29 NOTE — Telephone Encounter (Signed)
Called patient to tell her that Dr. Lindi Adie said that it was ok to stop Arimidex. Patient verbalized understanding.

## 2014-09-26 ENCOUNTER — Telehealth: Payer: Self-pay | Admitting: Hematology and Oncology

## 2014-09-26 NOTE — Telephone Encounter (Signed)
Returned patients call to reschedule her appointment

## 2014-10-03 ENCOUNTER — Other Ambulatory Visit: Payer: Self-pay | Admitting: *Deleted

## 2014-10-11 ENCOUNTER — Other Ambulatory Visit: Payer: Self-pay

## 2014-10-11 DIAGNOSIS — C50919 Malignant neoplasm of unspecified site of unspecified female breast: Secondary | ICD-10-CM

## 2014-10-11 NOTE — Progress Notes (Signed)
[facility]aced [facility].  Pt is scheduled for 6/22 for mammo and bone density.

## 2014-10-18 ENCOUNTER — Ambulatory Visit: Payer: Medicare HMO | Admitting: Hematology and Oncology

## 2014-10-18 ENCOUNTER — Other Ambulatory Visit: Payer: Medicare HMO

## 2014-10-24 ENCOUNTER — Other Ambulatory Visit (HOSPITAL_BASED_OUTPATIENT_CLINIC_OR_DEPARTMENT_OTHER): Payer: PPO

## 2014-10-24 ENCOUNTER — Ambulatory Visit (HOSPITAL_BASED_OUTPATIENT_CLINIC_OR_DEPARTMENT_OTHER): Payer: PPO | Admitting: Hematology and Oncology

## 2014-10-24 ENCOUNTER — Encounter: Payer: Self-pay | Admitting: Hematology and Oncology

## 2014-10-24 VITALS — BP 111/66 | HR 70 | Temp 98.6°F | Resp 18 | Ht 63.0 in | Wt 127.0 lb

## 2014-10-24 DIAGNOSIS — C50611 Malignant neoplasm of axillary tail of right female breast: Secondary | ICD-10-CM | POA: Diagnosis not present

## 2014-10-24 DIAGNOSIS — C773 Secondary and unspecified malignant neoplasm of axilla and upper limb lymph nodes: Secondary | ICD-10-CM | POA: Diagnosis not present

## 2014-10-24 DIAGNOSIS — C50911 Malignant neoplasm of unspecified site of right female breast: Secondary | ICD-10-CM

## 2014-10-24 DIAGNOSIS — Z170 Estrogen receptor positive status [ER+]: Secondary | ICD-10-CM | POA: Diagnosis not present

## 2014-10-24 DIAGNOSIS — C50919 Malignant neoplasm of unspecified site of unspecified female breast: Secondary | ICD-10-CM

## 2014-10-24 LAB — CBC WITH DIFFERENTIAL/PLATELET
BASO%: 0.9 % (ref 0.0–2.0)
Basophils Absolute: 0.1 10*3/uL (ref 0.0–0.1)
EOS%: 2.5 % (ref 0.0–7.0)
Eosinophils Absolute: 0.1 10*3/uL (ref 0.0–0.5)
HCT: 44.6 % (ref 34.8–46.6)
HGB: 14.7 g/dL (ref 11.6–15.9)
LYMPH#: 2.1 10*3/uL (ref 0.9–3.3)
LYMPH%: 35.2 % (ref 14.0–49.7)
MCH: 29.5 pg (ref 25.1–34.0)
MCHC: 33 g/dL (ref 31.5–36.0)
MCV: 89.2 fL (ref 79.5–101.0)
MONO#: 0.5 10*3/uL (ref 0.1–0.9)
MONO%: 8.5 % (ref 0.0–14.0)
NEUT#: 3.1 10*3/uL (ref 1.5–6.5)
NEUT%: 52.9 % (ref 38.4–76.8)
Platelets: 168 10*3/uL (ref 145–400)
RBC: 5 10*6/uL (ref 3.70–5.45)
RDW: 12.7 % (ref 11.2–14.5)
WBC: 5.9 10*3/uL (ref 3.9–10.3)

## 2014-10-24 LAB — COMPREHENSIVE METABOLIC PANEL (CC13)
ALBUMIN: 4.1 g/dL (ref 3.5–5.0)
ALT: 12 U/L (ref 0–55)
ANION GAP: 9 meq/L (ref 3–11)
AST: 16 U/L (ref 5–34)
Alkaline Phosphatase: 72 U/L (ref 40–150)
BUN: 12 mg/dL (ref 7.0–26.0)
CHLORIDE: 104 meq/L (ref 98–109)
CO2: 27 meq/L (ref 22–29)
Calcium: 9.4 mg/dL (ref 8.4–10.4)
Creatinine: 0.8 mg/dL (ref 0.6–1.1)
EGFR: 80 mL/min/{1.73_m2} — AB (ref >90)
GLUCOSE: 90 mg/dL (ref 70–140)
POTASSIUM: 4.8 meq/L (ref 3.5–5.1)
SODIUM: 140 meq/L (ref 136–145)
TOTAL PROTEIN: 6.7 g/dL (ref 6.4–8.3)
Total Bilirubin: 1.26 mg/dL — ABNORMAL HIGH (ref 0.20–1.20)

## 2014-10-24 MED ORDER — GLUCOSAMINE-CHONDROITIN 500-400 MG PO TABS: 1.0000 | ORAL_TABLET | Freq: Three times a day (TID) | ORAL | Status: AC

## 2014-10-24 NOTE — Addendum Note (Signed)
Addended by: Prentiss Bells on: 10/24/2014 03:15 PM   Modules accepted: Medications

## 2014-10-24 NOTE — Assessment & Plan Note (Signed)
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 ER 97% PR 81% Ki-67 10% HER-2 negative ratio at 1.29. Status post lumpectomy with 2 positive lymph nodes out of 8. Patient had low risk Oncotype DX and did not require chemotherapy, Arimidex 1 mg daily started 12/26/2009  Arimidex toxicities:  Breast Cancer Surveillance: 1. Breast exam 10/24/2014: Normal 2. Mammogram 10/10/2014 No abnormalities. Postsurgical changes. Breast Density Category B. I recommended that she get 3-D mammograms for surveillance. Discussed the differences between different breast density categories.

## 2014-10-24 NOTE — Progress Notes (Signed)
Patient Care Team: Tommy Medal, MD as PCP - General (Internal Medicine)  DIAGNOSIS: Breast cancer, right breast   Staging form: Breast, AJCC 7th Edition     Clinical: No stage assigned - Unsigned     Pathologic: Stage IIA (T1c, N1, cM0) - Signed by Rulon Eisenmenger, MD on 12/14/2013   SUMMARY OF ONCOLOGIC HISTORY:   Breast cancer, right breast   08/26/2009 Initial Diagnosis Breast CA: 1.0 cm grade 1 invasive ductal carcinoma 2/8 lymph nodes positive   08/31/2009 Surgery Right breast lumpectomy and axillary node dissection with concurrent left retroareolar excisional biopsy of benign ductal ectasia T1 C. N1 M0 invasive ductal carcinoma: 2/8 LN positive ER 97% PR 81% Ki-67 10% HER-2 negative ratio 1.29   08/31/2009 Procedure Oncotype DX recurrence score 13 with -5 year risk of recurrence of 8%. Did not receive chemotherapy   11/14/2009 - 12/25/2009 Radiation Therapy Radiation therapy to lumpectomy site   12/26/2009 - 05/25/2014 Anti-estrogen oral therapy Arimidex 1 mg by mouth daily    CHIEF COMPLIANT: Follow-up of right breast cancer  INTERVAL HISTORY: Sarah Lester is a 69 year old above-mentioned history of right breast cancer with low risk Oncotype DX underwent lumpectomy radiation and Arimidex which she stopped it herself January 2016. She did this because she was gaining weight very she lives very healthy life eating lots of fruits and vegetables walking and exercising and doing healthy options.  REVIEW OF SYSTEMS:   Constitutional: Denies fevers, chills or abnormal weight loss Eyes: Denies blurriness of vision Ears, nose, mouth, throat, and face: Denies mucositis or sore throat Respiratory: Denies cough, dyspnea or wheezes Cardiovascular: Denies palpitation, chest discomfort or lower extremity swelling Gastrointestinal:  Denies nausea, heartburn or change in bowel habits Skin: Denies abnormal skin rashes Lymphatics: Denies new lymphadenopathy or easy bruising Neurological:Denies  numbness, tingling or new weaknesses Behavioral/Psych: Mood is stable, no new changes  Breast:  denies any pain or lumps or nodules in either breasts All other systems were reviewed with the patient and are negative.  I have reviewed the past medical history, past surgical history, social history and family history with the patient and they are unchanged from previous note.  ALLERGIES:  has No Known Allergies.  MEDICATIONS:  Current Outpatient Prescriptions  Medication Sig Dispense Refill  . cholecalciferol (VITAMIN D) 1000 UNITS tablet Take 1,000 Units by mouth daily.    Marland Kitchen glucosamine-chondroitin (MAX GLUCOSAMINE CHONDROITIN) 500-400 MG tablet Take 1 tablet by mouth 3 (three) times daily. 30 tablet 0   No current facility-administered medications for this visit.    PHYSICAL EXAMINATION: ECOG PERFORMANCE STATUS: 0 - Asymptomatic  Filed Vitals:   10/24/14 1418  BP: 111/66  Pulse: 70  Temp: 98.6 F (37 C)  Resp: 18   Filed Weights   10/24/14 1418  Weight: 127 lb (57.607 kg)    GENERAL:alert, no distress and comfortable SKIN: skin color, texture, turgor are normal, no rashes or significant lesions EYES: normal, Conjunctiva are pink and non-injected, sclera clear OROPHARYNX:no exudate, no erythema and lips, buccal mucosa, and tongue normal  NECK: supple, thyroid normal size, non-tender, without nodularity LYMPH:  no palpable lymphadenopathy in the cervical, axillary or inguinal LUNGS: clear to auscultation and percussion with normal breathing effort HEART: regular rate & rhythm and no murmurs and no lower extremity edema ABDOMEN:abdomen soft, non-tender and normal bowel sounds Musculoskeletal:no cyanosis of digits and no clubbing  NEURO: alert & oriented x 3 with fluent speech, no focal motor/sensory deficits BREAST: No palpable masses or  nodules in either right or left breasts. No palpable axillary supraclavicular or infraclavicular adenopathy no breast tenderness or nipple  discharge. (exam performed in the presence of a chaperone)  LABORATORY DATA:  I have reviewed the data as listed   Chemistry      Component Value Date/Time   NA 141 12/14/2013 1230   NA 142 08/20/2013 0430   K 4.0 12/14/2013 1230   K 3.7 08/20/2013 0430   CL 106 08/20/2013 0430   CL 105 06/06/2012 0828   CO2 28 12/14/2013 1230   CO2 26 08/20/2013 0430   BUN 10.1 12/14/2013 1230   BUN 9 08/20/2013 0430   CREATININE 0.8 12/14/2013 1230   CREATININE 0.66 08/20/2013 0430      Component Value Date/Time   CALCIUM 9.9 12/14/2013 1230   CALCIUM 8.5 08/20/2013 0430   ALKPHOS 74 12/14/2013 1230   ALKPHOS 74 08/08/2013 0950   AST 15 12/14/2013 1230   AST 16 08/08/2013 0950   ALT 9 12/14/2013 1230   ALT 11 08/08/2013 0950   BILITOT 0.75 12/14/2013 1230   BILITOT 1.0 08/08/2013 0950       Lab Results  Component Value Date   WBC 5.9 10/24/2014   HGB 14.7 10/24/2014   HCT 44.6 10/24/2014   MCV 89.2 10/24/2014   PLT 168 10/24/2014   NEUTROABS 3.1 10/24/2014    ASSESSMENT & PLAN:  Breast cancer, right breast 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 ER 97% PR 81% Ki-67 10% HER-2 negative ratio at 1.29. Status post lumpectomy with 2 positive lymph nodes out of 8. Patient had low risk Oncotype DX and did not require chemotherapy, Arimidex 1 mg daily started 12/26/2009 stopped by herself January 2016  Arimidex toxicities: Fatigue and weight gain  Breast Cancer Surveillance: 1. Breast exam 10/24/2014: Normal 2. Mammogram 10/17/2014 No abnormalities. Postsurgical changes. Breast Density Category B. I recommended that she get 3-D mammograms for surveillance. Discussed the differences between different breast density categories.    Return to clinic in 1 year for surveillance breast exams. I discussed with her that there is no role of blood tests for breast cancer surveillance. She will come back in one year to the survivorship clinic  No orders of the defined  types were placed in this encounter.   The patient has a good understanding of the overall plan. she agrees with it. she will call with any problems that may develop before the next visit here.   Rulon Eisenmenger, MD

## 2015-06-13 DIAGNOSIS — H2513 Age-related nuclear cataract, bilateral: Secondary | ICD-10-CM | POA: Diagnosis not present

## 2015-09-03 ENCOUNTER — Telehealth: Payer: Self-pay | Admitting: Nurse Practitioner

## 2015-09-03 NOTE — Telephone Encounter (Signed)
Patient called re annual follow up appointment. Gave patient appointment for LTS visit with HM 6/29 @ 8:30 am.

## 2015-09-05 DIAGNOSIS — M810 Age-related osteoporosis without current pathological fracture: Secondary | ICD-10-CM | POA: Diagnosis not present

## 2015-09-05 DIAGNOSIS — R5383 Other fatigue: Secondary | ICD-10-CM | POA: Diagnosis not present

## 2015-09-05 DIAGNOSIS — E782 Mixed hyperlipidemia: Secondary | ICD-10-CM | POA: Diagnosis not present

## 2015-09-24 DIAGNOSIS — E782 Mixed hyperlipidemia: Secondary | ICD-10-CM | POA: Diagnosis not present

## 2015-09-24 DIAGNOSIS — E559 Vitamin D deficiency, unspecified: Secondary | ICD-10-CM | POA: Diagnosis not present

## 2015-09-24 DIAGNOSIS — Z0000 Encounter for general adult medical examination without abnormal findings: Secondary | ICD-10-CM | POA: Diagnosis not present

## 2015-09-24 DIAGNOSIS — R5383 Other fatigue: Secondary | ICD-10-CM | POA: Diagnosis not present

## 2015-09-30 DIAGNOSIS — Z01411 Encounter for gynecological examination (general) (routine) with abnormal findings: Secondary | ICD-10-CM | POA: Diagnosis not present

## 2015-09-30 DIAGNOSIS — Z853 Personal history of malignant neoplasm of breast: Secondary | ICD-10-CM | POA: Diagnosis not present

## 2015-09-30 DIAGNOSIS — M810 Age-related osteoporosis without current pathological fracture: Secondary | ICD-10-CM | POA: Diagnosis not present

## 2015-10-10 DIAGNOSIS — E782 Mixed hyperlipidemia: Secondary | ICD-10-CM | POA: Diagnosis not present

## 2015-10-10 DIAGNOSIS — M810 Age-related osteoporosis without current pathological fracture: Secondary | ICD-10-CM | POA: Diagnosis not present

## 2015-10-10 DIAGNOSIS — R5383 Other fatigue: Secondary | ICD-10-CM | POA: Diagnosis not present

## 2015-10-22 DIAGNOSIS — Z853 Personal history of malignant neoplasm of breast: Secondary | ICD-10-CM | POA: Diagnosis not present

## 2015-10-24 ENCOUNTER — Ambulatory Visit (HOSPITAL_BASED_OUTPATIENT_CLINIC_OR_DEPARTMENT_OTHER): Payer: PPO | Admitting: Nurse Practitioner

## 2015-10-24 ENCOUNTER — Encounter: Payer: Self-pay | Admitting: Nurse Practitioner

## 2015-10-24 ENCOUNTER — Telehealth: Payer: Self-pay | Admitting: Nurse Practitioner

## 2015-10-24 VITALS — BP 98/58 | HR 85 | Temp 98.7°F | Resp 18 | Ht 63.0 in | Wt 128.8 lb

## 2015-10-24 DIAGNOSIS — Z853 Personal history of malignant neoplasm of breast: Secondary | ICD-10-CM | POA: Diagnosis not present

## 2015-10-24 DIAGNOSIS — C50911 Malignant neoplasm of unspecified site of right female breast: Secondary | ICD-10-CM

## 2015-10-24 NOTE — Patient Instructions (Addendum)
Thank you for coming in today!  As we discussed, please continue to perform your self breast exam and report any changes. If you note any new symptoms (please see below), be sure to notify us ASAP.  Your mammogram will be due in June 2018.  We'll have you return in one year's time to the Survivorship clinic for your next appointment or sooner if you have any problems. Please be sure to stop by scheduling on your way out to make those appointment(s).  I will ask our practice administrator about emailing you with a reminder next year.   Looking forward to working with you in the future!  Let us know if you have any questions!  Symptoms to Watch for and Report to Your Provider  . Return of the cancer symptoms you had before- such as a lump or new growth where your cancer first started . New or unusual pain that seems unrelated to an injury and does not go away, including back pain or bone pain . Weight loss without trying/intending . Unexplained bleeding . A rash or allergic reaction, such as swelling, severe itching or wheezing . Chills or fevers . Persistent headaches . Shortness of breath or difficulty breathing . Bloody stools or blood in your urine . Lumps, bumps, swelling and/or nipple discharge . Nausea, vomiting, diarrhea, loss of appetite, or trouble swallowing . A cough that doesn't go away . Abdominal pain . Swelling in your arms or legs . Fractures . Hot flashes or other menopausal symptoms . Any other signs mentioned by your doctor or nurse or any unusual symptoms                 that you just can't explain   NOTE: Just because you have certain symptoms, it doesn't mean the cancer has come back or you have a new cancer. Symptoms can be due to other problems that need to be addressed.  It is important to watch for these symptoms and report them to your provider so you can be medically evaluated for any of these concerns!     Living a Life of Wellness After Cancer:  *Note: Please  consult your health care provider before using any medications, supplements, over-the-counter products, or other interventions.  Also, please consult your primary care provider before you begin any lifestyle program (diet, exercise, etc.).  Your safety is our top priority and we want to make sure you continue to live a long and healthy life!    Healthy Lifestyle Recommendations  As a cancer survivor, it is important develop a lifelong commitment to a healthy lifestyle. A healthy lifestyle can prevent cancer from returning as well as prevent other diseases like heart disease, diabetes and high blood pressure.  These are some things that you can do to have a healthy lifestyle:  Marland Kitchen Maintain a healthy weight.  . Exercise daily per your doctor's orders. . Eat a balanced diet high in fruits, vegetables, bran, and fiber. Limit intake of red meat      and processed foods.  . Limit how much alcohol you consume, if at all. Ali Lowe regular bone mineral density testing for osteoporosis.  . Talk to your doctor about cardiovascular disease or "heart disease" screening. . Stop smoking (if you smoke). . Know your family history. . Be mindful of your emotional, social, and spiritual needs. . Meet regularly with a Primary Care Provider (PCP). Find a PCP if you do not  already have one. . Talk to your doctor about regular cancer screening including screening for colon           cancer, GYN cancers, and skin cancer.

## 2015-10-24 NOTE — Telephone Encounter (Signed)
appt made and avs printed °

## 2015-10-24 NOTE — Progress Notes (Signed)
CLINIC:  Cancer Survivorship   REASON FOR VISIT:  Routine follow-up post-treatment for history of breast cancer.  BRIEF ONCOLOGIC HISTORY:    Breast cancer, right breast ([place])   08/26/2009 Initial Diagnosis Breast CA: 1.0 cm grade 1 invasive ductal carcinoma 2/8 lymph nodes positive   08/31/2009 Surgery Right breast lumpectomy and axillary node dissection with concurrent left retroareolar excisional biopsy of benign ductal ectasia T1 C. N1 M0 invasive ductal carcinoma: 2/8 LN positive ER 97% PR 81% Ki-67 10% HER-2 negative ratio 1.29   08/31/2009 Procedure Oncotype DX recurrence score 13 with -5 year risk of recurrence of 8%. Did not receive chemotherapy   11/14/2009 - 12/25/2009 Radiation Therapy Radiation therapy to lumpectomy site   12/26/2009 - 05/25/2014 Anti-estrogen oral therapy Arimidex 1 mg by mouth daily    INTERVAL HISTORY:  Sarah Lester presents to the [facility] today for ongoing follow up regarding her history of breast cancer. Overall, Sarah Lester reports feeling doing well since her last visit with Dr. Lindi Adie in June 2016.  She has not noticed any change within her breast and her last mammogram was performed earlier this week and was unremarkable.  She denies any headache, cough, shortness of breath, or bone pain other than her arthritis pain which is stable.  She reports a good appetite and denies any weight loss.    REVIEW OF SYSTEMS:  General: Denies fever, chills, unintentional weight loss, or generalized fatigue.  HEENT: Wears glasses.  Denies visual changes, hearing loss, mouth sores, or difficulty swallowing. Cardiac: Denies palpitations and lower extremity edema.  Respiratory: Denies wheeze or dyspnea on exertion.  Breast: As above. GI: Denies abdominal pain, constipation, diarrhea, nausea, or vomiting.  GU: Denies dysuria, hematuria, vaginal bleeding, vaginal discharge, or vaginal dryness.  Musculoskeletal: As above. Neuro: Denies recent fall or numbness /  tingling in her extremities.  Skin: Denies rash, pruritis, or open wounds.  Psych: Denies depression, anxiety, insomnia, or memory loss.   A 14-point review of systems was completed and was negative, except as noted above.   ONCOLOGY TREATMENT TEAM:  1. Medical Oncologist: Dr. Truddie Coco / Humphrey Rolls / Lindi Adie 2. Radiation Oncologist: Dr. Tammi Klippel    PAST MEDICAL/SURGICAL HISTORY:  Past Medical History  Diagnosis Date  . Cancer [facility]) 2011     Right axillary  . Complication of anesthesia     long time to wake up  . PONV (postoperative nausea and vomiting)   . Arthritis    Past Surgical History  Procedure Laterality Date  . Axillary surgery  2011    cancer/radiation Tx  . Cesarean section  1978  . Breast surgery Right 2011    lumpectomy  . Colonoscopy  2013  . Tonsillectomy      age 38  . Total hip arthroplasty Left 08/18/2013    Procedure: LEFT TOTAL HIP ARTHROPLASTY ANTERIOR APPROACH;  Surgeon: Gearlean Alf, MD;  Location: [facility];  Service: Orthopedics;  Laterality: Left;     ALLERGIES:  No Known Allergies   CURRENT MEDICATIONS:  Current Outpatient Prescriptions on File Prior to Visit  Medication Sig Dispense Refill  . cholecalciferol (VITAMIN D) 1000 UNITS tablet Take 1,000 Units by mouth daily. Reported on 10/24/2015    . glucosamine-chondroitin (MAX GLUCOSAMINE CHONDROITIN) 500-400 MG tablet Take 1 tablet by mouth 3 (three) times daily. 30 tablet 0   No current facility-administered medications on file prior to visit.     ONCOLOGIC FAMILY HISTORY:  Family History  Problem Relation Age of Onset  .  Colon cancer Neg Hx   . Esophageal cancer Neg Hx   . Rectal cancer Neg Hx   . Stomach cancer Neg Hx   . Alzheimer's disease Mother      GENETIC COUNSELING/TESTING: No   SOCIAL HISTORY:  Sarah Lester is widowed and lives alone in [place], [place].  She has 3 children. Sarah Lester is currently retired.  She is a former smoker and uses alcohol daily.  She  denies any current or history of illicit drug use.     PHYSICAL EXAMINATION:  Vital Signs: Filed Vitals:   10/24/15 0841  BP: 98/58  Pulse: 85  Temp: 98.7 F (37.1 C)  Resp: 18   Weight: 128# (stable; 127 # 09/2014) ECOG performance status: 0 General: Well-nourished, well-appearing female in no acute distress.  She is unaccompanied in clinic today.   HEENT: Head is atraumatic and normocephalic.  Pupils equal and reactive to light and accomodation. Conjunctivae clear without exudate.  Sclerae anicteric. Oral mucosa is pink, moist, and intact without lesions.  Oropharynx is pink without lesions or erythema.  Lymph: No cervical, supraclavicular, infraclavicular, or axillary lymphadenopathy noted on palpation.  Cardiovascular: Regular rate and rhythm without murmurs, rubs, or gallops. Respiratory: Clear to auscultation bilaterally. Chest expansion symmetric without accessory muscle use on inspiration or expiration.  Breast: Bilateral breast exam performed.  Incisions along right breast and axilla without nodularity.  Left nipple inverted (normal for her per her report).  Small 0.5 cm area at 6:00 right breast consistent with fibroglandular tissue, otherwise, no mass or lesion bilaterally. GI: Abdomen soft and round. No tenderness to palpation. Bowel sounds normoactive in 4 quadrants. No hepatosplenomegaly.   GU: Deferred.  Musculoskeletal: Muscle strength 5/5 in all extremities.   Neuro: No focal deficits. Steady gait.  Psych: Mood and affect normal and appropriate for situation.  Extremities: No edema, cyanosis, or clubbing.  Skin: Warm and dry. No open lesions noted.   LABORATORY DATA:  No results found for this or any previous visit (from the past 2160 hour(s)).  DIAGNOSTIC IMAGING: Bilateral diagnostic mammogram with tomo performed 10/22/2015 showing post op changes in the right breast with no mass or lesion, nor calcifications, in either breast bilaterally.  Breast density category B:  scattered fibroglandular density.     ASSESSMENT AND PLAN:   1. History of breast cancer: Stage IIA (T1cN1) invasive ductal carcinoma of the right breast (08/2009), ER positive, PR positive, HER2/neu negative, S/P lumpectomy/SLNB with 2/8 lymph nodes positive, oncotype RS 13, no chemotherapy, S/P adjuvant radiation therapy to right breast (compelted 11/2009) with adjuvant anastrozole begun 12/2009 and continued through 04/2014, now on observation. Sarah Lester is doing well with no clinical symptoms worrisome for cancer recurrence at this time. I have reviewed the recommendations for ongoing surveillance with her and she will follow-up with [place] in Survivorship in one year's time with history and physical exam per surveillance protocol.  She will be due mammography in June 2018. She was instructed to make [place] aware if she notes any change within her breast, any new symptoms such as pain, shortness of breath, weight loss, or fatigue.   2. Bone health:  Given Sarah Lester's age/history of breast cancer and her current treatment regimen including past endocrine therapy with anastrozole, she is at risk for bone demineralization.  Per her report her last DEXA scan was performed last year at [facility] revealing osteoporosis, and she was begun on fosamax. We will continue to monitor this closely for any further  changes as a result of endocrine therapy.  In the meantime, she was encouraged to increase her consumption of foods rich in calcium and vitamin D as well as to increase her weight-bearing activities.  She was given education on specific activities to promote bone health.  3. Cancer screening:  Due to Sarah Lester's history and her age, she should receive screening for skin cancers, colon cancer, and gynecologic cancers.  The information and recommendations were shared with the patient and in her written after visit summary.  4. Health maintenance and wellness promotion:Sarah Lester and I discussed  recommendations to maximize nutrition and minimize recurrence, such as increased intake of fruits, vegetables, lean proteins, and minimizing the intake of red meats and processed foods.  She was also encouraged to continue to engage in moderate to vigorous exercise for 30 minutes per day most days of the week.  She was instructed to limit her alcohol consumption and continue to abstain from tobacco use.     5. Support services/counseling: Sarah Lester was offered support today through active listening and expressive supportive counseling.  She was congratulated on reaching another year as a cancer survivor!  A total of 30 minutes of face-to-face time was spent with this patient with greater than 50% of that time in counseling and care-coordination.   Sylvan Cheese, NP  Survivorship Program [facility] [phone]   Note: [place] Tommy Medal, [place] [phone]

## 2015-11-04 DIAGNOSIS — M87244 Osteonecrosis due to previous trauma, right finger(s): Secondary | ICD-10-CM | POA: Diagnosis not present

## 2015-11-12 DIAGNOSIS — Z79899 Other long term (current) drug therapy: Secondary | ICD-10-CM | POA: Diagnosis not present

## 2015-11-12 DIAGNOSIS — M19041 Primary osteoarthritis, right hand: Secondary | ICD-10-CM | POA: Diagnosis not present

## 2015-11-12 DIAGNOSIS — M810 Age-related osteoporosis without current pathological fracture: Secondary | ICD-10-CM | POA: Diagnosis not present

## 2015-12-21 ENCOUNTER — Emergency Department (HOSPITAL_COMMUNITY)
Admission: EM | Admit: 2015-12-21 | Discharge: 2015-12-21 | Disposition: A | Discharge status: Home / Self Care | Payer: PPO | Attending: Emergency Medicine | Admitting: Emergency Medicine

## 2015-12-21 ENCOUNTER — Emergency Department (HOSPITAL_COMMUNITY): Payer: PPO

## 2015-12-21 ENCOUNTER — Encounter (HOSPITAL_COMMUNITY): Payer: Self-pay | Admitting: Emergency Medicine

## 2015-12-21 DIAGNOSIS — Y999 Unspecified external cause status: Secondary | ICD-10-CM | POA: Insufficient documentation

## 2015-12-21 DIAGNOSIS — Z853 Personal history of malignant neoplasm of breast: Secondary | ICD-10-CM | POA: Insufficient documentation

## 2015-12-21 DIAGNOSIS — T2112XA Burn of first degree of abdominal wall, initial encounter: Secondary | ICD-10-CM | POA: Insufficient documentation

## 2015-12-21 DIAGNOSIS — Z23 Encounter for immunization: Secondary | ICD-10-CM | POA: Diagnosis not present

## 2015-12-21 DIAGNOSIS — Y939 Activity, unspecified: Secondary | ICD-10-CM | POA: Insufficient documentation

## 2015-12-21 DIAGNOSIS — Z87891 Personal history of nicotine dependence: Secondary | ICD-10-CM | POA: Diagnosis not present

## 2015-12-21 DIAGNOSIS — S60052A Contusion of left little finger without damage to nail, initial encounter: Secondary | ICD-10-CM | POA: Insufficient documentation

## 2015-12-21 DIAGNOSIS — Z96642 Presence of left artificial hip joint: Secondary | ICD-10-CM | POA: Diagnosis not present

## 2015-12-21 DIAGNOSIS — S30811A Abrasion of abdominal wall, initial encounter: Secondary | ICD-10-CM | POA: Diagnosis not present

## 2015-12-21 DIAGNOSIS — S60222A Contusion of left hand, initial encounter: Secondary | ICD-10-CM | POA: Diagnosis not present

## 2015-12-21 DIAGNOSIS — T300 Burn of unspecified body region, unspecified degree: Secondary | ICD-10-CM

## 2015-12-21 DIAGNOSIS — S6992XA Unspecified injury of left wrist, hand and finger(s), initial encounter: Secondary | ICD-10-CM | POA: Diagnosis not present

## 2015-12-21 DIAGNOSIS — M79645 Pain in left finger(s): Secondary | ICD-10-CM | POA: Diagnosis not present

## 2015-12-21 DIAGNOSIS — M79642 Pain in left hand: Secondary | ICD-10-CM | POA: Insufficient documentation

## 2015-12-21 DIAGNOSIS — Y92410 Unspecified street and highway as the place of occurrence of the external cause: Secondary | ICD-10-CM | POA: Diagnosis not present

## 2015-12-21 MED ORDER — SILVER SULFADIAZINE 1 % EX CREA
TOPICAL_CREAM | Freq: Once | CUTANEOUS | Status: AC
  Administered 2015-12-21: 1 via TOPICAL
  Filled 2015-12-21: qty 85

## 2015-12-21 MED ORDER — TETANUS-DIPHTH-ACELL PERTUSSIS 5-2.5-18.5 LF-MCG/0.5 IM SUSP
0.5000 mL | Freq: Once | INTRAMUSCULAR | Status: AC
  Administered 2015-12-21: 0.5 mL via INTRAMUSCULAR
  Filled 2015-12-21: qty 0.5

## 2015-12-21 MED ORDER — SILVER SULFADIAZINE 1 % EX CREA
1.0000 | TOPICAL_CREAM | Freq: Two times a day (BID) | CUTANEOUS | 0 refills | Status: AC
Start: 2015-12-21 — End: 2015-12-28
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
## 2015-12-21 NOTE — ED Notes (Signed)
MD Zavitz at bedside, wants to cancel CT.

## 2015-12-21 NOTE — ED Notes (Signed)
Patient transported to X-ray 
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
## 2016-09-11 DIAGNOSIS — Z96642 Presence of left artificial hip joint: Secondary | ICD-10-CM | POA: Diagnosis not present

## 2016-09-11 DIAGNOSIS — M419 Scoliosis, unspecified: Secondary | ICD-10-CM | POA: Diagnosis not present

## 2016-09-11 DIAGNOSIS — M1612 Unilateral primary osteoarthritis, left hip: Secondary | ICD-10-CM | POA: Diagnosis not present

## 2016-09-11 DIAGNOSIS — Z471 Aftercare following joint replacement surgery: Secondary | ICD-10-CM | POA: Diagnosis not present

## 2016-09-11 DIAGNOSIS — M545 Low back pain: Secondary | ICD-10-CM | POA: Diagnosis not present

## 2016-09-22 DIAGNOSIS — M545 Low back pain: Secondary | ICD-10-CM | POA: Diagnosis not present

## 2016-09-30 DIAGNOSIS — Z01419 Encounter for gynecological examination (general) (routine) without abnormal findings: Secondary | ICD-10-CM | POA: Diagnosis not present

## 2016-10-07 DIAGNOSIS — H40013 Open angle with borderline findings, low risk, bilateral: Secondary | ICD-10-CM | POA: Diagnosis not present

## 2016-10-07 DIAGNOSIS — H2513 Age-related nuclear cataract, bilateral: Secondary | ICD-10-CM | POA: Diagnosis not present

## 2016-10-22 ENCOUNTER — Encounter: Payer: PPO | Admitting: Nurse Practitioner

## 2016-10-23 DIAGNOSIS — M810 Age-related osteoporosis without current pathological fracture: Secondary | ICD-10-CM | POA: Diagnosis not present

## 2016-10-23 DIAGNOSIS — Z1231 Encounter for screening mammogram for malignant neoplasm of breast: Secondary | ICD-10-CM | POA: Diagnosis not present

## 2016-10-23 DIAGNOSIS — Z803 Family history of malignant neoplasm of breast: Secondary | ICD-10-CM | POA: Diagnosis not present

## 2016-10-29 ENCOUNTER — Encounter: Payer: Self-pay | Admitting: Adult Health

## 2016-10-29 ENCOUNTER — Ambulatory Visit (HOSPITAL_BASED_OUTPATIENT_CLINIC_OR_DEPARTMENT_OTHER): Payer: PPO | Admitting: Adult Health

## 2016-10-29 VITALS — BP 99/50 | HR 81 | Temp 98.1°F | Resp 18 | Ht 63.5 in | Wt 132.0 lb

## 2016-10-29 DIAGNOSIS — Z853 Personal history of malignant neoplasm of breast: Secondary | ICD-10-CM | POA: Diagnosis not present

## 2016-10-29 DIAGNOSIS — C50911 Malignant neoplasm of unspecified site of right female breast: Secondary | ICD-10-CM

## 2016-10-29 DIAGNOSIS — Z170 Estrogen receptor positive status [ER+]: Principal | ICD-10-CM

## 2016-10-29 NOTE — Pre-Procedure Instructions (Signed)
[place] and [place]

## 2016-10-29 NOTE — Progress Notes (Signed)
CLINIC:  Survivorship   REASON FOR VISIT:  Routine follow-up for history of breast cancer.   BRIEF ONCOLOGIC HISTORY:    Breast cancer, right breast ([place])   08/26/2009 Initial Diagnosis    Breast CA: 1.0 cm grade 1 invasive ductal carcinoma 2/8 lymph nodes positive      08/31/2009 Surgery    Right breast lumpectomy and axillary node dissection with concurrent left retroareolar excisional biopsy of benign ductal ectasia T1 C. N1 M0 invasive ductal carcinoma: 2/8 LN positive ER 97% PR 81% Ki-67 10% HER-2 negative ratio 1.29      08/31/2009 Procedure    Oncotype DX recurrence score 13 with -5 year risk of recurrence of 8%. Did not receive chemotherapy      11/14/2009 - 12/25/2009 Radiation Therapy    Radiation therapy to lumpectomy site      12/26/2009 - 05/25/2014 Anti-estrogen oral therapy    Arimidex 1 mg by mouth daily        INTERVAL HISTORY:  Ms. Durrett presents to the [facility] today for routine follow-up for her history of breast cancer.  Overall, she reports feeling quite well. She did have mammo and bone density last week.  She has not had any issues.  She sees her PCP and GYN regularly.  She has been doing well and eats healthy along with exercises daily.     REVIEW OF SYSTEMS:  Review of Systems  Constitutional: Negative for appetite change, chills, diaphoresis, fatigue, fever and unexpected weight change.  HENT:   Negative for hearing loss and lump/mass.   Eyes: Negative for eye problems and icterus.  Respiratory: Negative for chest tightness and cough.   Cardiovascular: Negative for chest pain and leg swelling.  Gastrointestinal: Negative for abdominal distention, abdominal pain, constipation, diarrhea, nausea and vomiting.  Endocrine: Negative for hot flashes.  Genitourinary: Negative for difficulty urinating.   Musculoskeletal: Negative for arthralgias and back pain.  Skin: Negative for itching and rash.  Neurological: Negative for dizziness,  extremity weakness and headaches.  Hematological: Negative for adenopathy.  Psychiatric/Behavioral: Negative for depression. The patient is not nervous/anxious.   Breast: Denies any new nodularity, masses, tenderness, nipple changes, or nipple discharge.       PAST MEDICAL/SURGICAL HISTORY:  Past Medical History:  Diagnosis Date  . Arthritis   . Cancer [facility]) 2011    Right axillary  . Complication of anesthesia    long time to wake up  . PONV (postoperative nausea and vomiting)    Past Surgical History:  Procedure Laterality Date  . AXILLARY SURGERY  2011   cancer/radiation Tx  . BREAST SURGERY Right 2011   lumpectomy  . [place]  . COLONOSCOPY  2013  . TONSILLECTOMY     age 40  . TOTAL HIP ARTHROPLASTY Left 08/18/2013   Procedure: LEFT TOTAL HIP ARTHROPLASTY ANTERIOR APPROACH;  Surgeon: Gearlean Alf, MD;  Location: [place];  Service: Orthopedics;  Laterality: Left;     ALLERGIES:  No Known Allergies   CURRENT MEDICATIONS:  Outpatient Encounter Prescriptions as of 10/29/2016  Medication Sig Note  . Calcium 250 MG CAPS Take by mouth.   . cholecalciferol (VITAMIN D) 1000 UNITS tablet Take 1,000 Units by mouth daily. Reported on 10/24/2015   . glucosamine-chondroitin (MAX GLUCOSAMINE CHONDROITIN) 500-400 MG tablet Take 1 tablet by mouth 3 (three) times daily. (Patient taking differently: Take 1 tablet by mouth 3 (three) times daily. )   . [DISCONTINUED] alendronate (FOSAMAX) 70 MG tablet take  1 tablet by mouth every week on an empty stomach with 6-8 oz...  (REFER TO PRESCRIPTION NOTES). 10/24/2015: Received from: External Pharmacy   No facility-administered encounter medications on file as of 10/29/2016.      ONCOLOGIC FAMILY HISTORY:  Family History  Problem Relation Age of Onset  . Alzheimer's disease Mother   . Colon cancer Neg Hx   . Esophageal cancer Neg Hx   . Rectal cancer Neg Hx   . Stomach cancer Neg Hx     GENETIC COUNSELING/TESTING: Not  indicated at this time  SOCIAL HISTORY:  HAWRAA STAMBAUGH is widowed and lives alone in [place], [place].  She has 2 children and they live in [place] and [place].  Ms. Bannister is currently retired.  She denies any current or history of tobacco, alcohol, or illicit drug use.     PHYSICAL EXAMINATION:  Vital Signs: Vitals:   10/29/16 0912  BP: (!) 99/50  Pulse: 81  Resp: 18  Temp: 98.1 F (36.7 C)   Filed Weights   10/29/16 0912  Weight: 132 lb (59.9 kg)   General: Well-nourished, well-appearing female in no acute distress.  Unaccompanied today.   HEENT: Head is normocephalic.  Pupils equal and reactive to light. Conjunctivae clear without exudate.  Sclerae anicteric. Oral mucosa is pink, moist.  Oropharynx is pink without lesions or erythema.  Lymph: No cervical, supraclavicular, or infraclavicular lymphadenopathy noted on palpation.  Cardiovascular: Regular rate and rhythm.Marland Kitchen Respiratory: Clear to auscultation bilaterally. Chest expansion symmetric; breathing non-labored.  Breast Exam:  -Left breast: No appreciable masses on palpation. No skin redness, thickening, or peau d'orange appearance; no nipple retraction or nipple discharge; -Right breast: No appreciable masses on palpation. No skin redness, thickening, or peau d'orange appearance; no nipple retraction or nipple discharge; mild distortion in symmetry at previous lumpectomy site well healed scar without erythema or nodularity. -Axilla: No axillary adenopathy bilaterally.  GI: Abdomen soft and round; non-tender, non-distended. Bowel sounds normoactive. No hepatosplenomegaly.   GU: Deferred.  Neuro: No focal deficits. Steady gait.  Psych: Mood and affect normal and appropriate for situation.  MSK: No focal spinal tenderness to palpation, full range of motion in bilateral upper extremities Extremities: No edema. Skin: Warm and dry.  LABORATORY DATA:  None for this visit   DIAGNOSTIC IMAGING:  Most  recent mammogram:   Bone Density:    ASSESSMENT AND PLAN:  Ms.. Sandeen is a pleasant 71 y.o. female with history of Stage IIA right breast invasive ductal carcinoma, ER+/PR+/HER2-, diagnosed in 08/2009, treated with lumpectomy, adjuvant radiation therapy, and anti-estrogen therapy with Anastrozole x 5 years finishing in 04/2014.  She [facility] for surveillance and routine follow-up.   1. History of breast cancer:  Ms. Arteaga is currently clinically and radiographically without evidence of disease or recurrence of breast cancer. She will be due for mammogram in 09/2017.  She has kept up to date with her mammograms and breast exams.  She still sees her GYN regularly and continues to follow with her PCP.  After discussion with her, we decided that she will continue to follow with GYN and her PCP and come back and see [place] if she has any issues whatsoever.    2. Bone health:  Ms. Troop has osteoporosis.  Her latest T score was on 10/23/2016 and was -4.3 in her left distal radius.  She takes calcium, vitamin d, and exercises regularly.  She will continue to follow with her PCP regarding her osteoporosis.  3. Cancer screening:  Due to Ms. Sealey's history and her age, she should receive screening for skin cancers, colon cancer. She was encouraged to follow-up with her PCP for appropriate cancer screenings.   4. Health maintenance and wellness promotion: Ms. Bushong was encouraged to consume 5-7 servings of fruits and vegetables per day. She was also encouraged to engage in moderate to vigorous exercise for 30 minutes per day most days of the week. She was instructed to limit her alcohol consumption and continue to abstain from tobacco use.  Dispo:  -Return to cancer center PRN -Mammogram in 09/2017    A total of (30) minutes of face-to-face time was spent with this patient with greater than 50% of that time in counseling and care-coordination.   Gardenia Phlegm,  [place] [phone]   Note: PRIMARY CARE PROVIDER Tommy Medal, [place] [phone]

## 2016-11-17 DIAGNOSIS — Z0000 Encounter for general adult medical examination without abnormal findings: Secondary | ICD-10-CM | POA: Diagnosis not present

## 2016-11-17 DIAGNOSIS — E782 Mixed hyperlipidemia: Secondary | ICD-10-CM | POA: Diagnosis not present

## 2016-11-17 DIAGNOSIS — M810 Age-related osteoporosis without current pathological fracture: Secondary | ICD-10-CM | POA: Diagnosis not present

## 2016-11-24 DIAGNOSIS — M810 Age-related osteoporosis without current pathological fracture: Secondary | ICD-10-CM | POA: Diagnosis not present

## 2016-11-24 DIAGNOSIS — R5383 Other fatigue: Secondary | ICD-10-CM | POA: Diagnosis not present

## 2016-11-24 DIAGNOSIS — E782 Mixed hyperlipidemia: Secondary | ICD-10-CM | POA: Diagnosis not present

## 2017-01-04 DIAGNOSIS — M25532 Pain in left wrist: Secondary | ICD-10-CM | POA: Diagnosis not present

## 2017-02-15 DIAGNOSIS — H25013 Cortical age-related cataract, bilateral: Secondary | ICD-10-CM | POA: Diagnosis not present

## 2017-02-15 DIAGNOSIS — H524 Presbyopia: Secondary | ICD-10-CM | POA: Diagnosis not present

## 2017-02-15 DIAGNOSIS — H2513 Age-related nuclear cataract, bilateral: Secondary | ICD-10-CM | POA: Diagnosis not present

## 2017-03-09 DIAGNOSIS — H25812 Combined forms of age-related cataract, left eye: Secondary | ICD-10-CM | POA: Diagnosis not present

## 2017-03-09 DIAGNOSIS — H25012 Cortical age-related cataract, left eye: Secondary | ICD-10-CM | POA: Diagnosis not present

## 2017-03-09 DIAGNOSIS — H2512 Age-related nuclear cataract, left eye: Secondary | ICD-10-CM | POA: Diagnosis not present

## 2017-03-23 DIAGNOSIS — H2511 Age-related nuclear cataract, right eye: Secondary | ICD-10-CM | POA: Diagnosis not present

## 2017-03-23 DIAGNOSIS — H25011 Cortical age-related cataract, right eye: Secondary | ICD-10-CM | POA: Diagnosis not present

## 2017-03-23 DIAGNOSIS — H25811 Combined forms of age-related cataract, right eye: Secondary | ICD-10-CM | POA: Diagnosis not present

## 2017-05-20 DIAGNOSIS — Z961 Presence of intraocular lens: Secondary | ICD-10-CM | POA: Diagnosis not present

## 2017-06-01 DIAGNOSIS — Z961 Presence of intraocular lens: Secondary | ICD-10-CM | POA: Diagnosis not present

## 2017-06-01 DIAGNOSIS — H04123 Dry eye syndrome of bilateral lacrimal glands: Secondary | ICD-10-CM | POA: Diagnosis not present

## 2017-08-23 DIAGNOSIS — H40013 Open angle with borderline findings, low risk, bilateral: Secondary | ICD-10-CM | POA: Diagnosis not present

## 2017-09-13 DIAGNOSIS — L821 Other seborrheic keratosis: Secondary | ICD-10-CM | POA: Diagnosis not present

## 2017-09-13 DIAGNOSIS — D229 Melanocytic nevi, unspecified: Secondary | ICD-10-CM | POA: Diagnosis not present

## 2017-11-05 DIAGNOSIS — Z1231 Encounter for screening mammogram for malignant neoplasm of breast: Secondary | ICD-10-CM | POA: Diagnosis not present

## 2017-11-22 DIAGNOSIS — H40013 Open angle with borderline findings, low risk, bilateral: Secondary | ICD-10-CM | POA: Diagnosis not present

## 2017-11-30 DIAGNOSIS — E782 Mixed hyperlipidemia: Secondary | ICD-10-CM | POA: Diagnosis not present

## 2017-11-30 DIAGNOSIS — R5383 Other fatigue: Secondary | ICD-10-CM | POA: Diagnosis not present

## 2017-11-30 DIAGNOSIS — Z0000 Encounter for general adult medical examination without abnormal findings: Secondary | ICD-10-CM | POA: Diagnosis not present

## 2017-12-03 DIAGNOSIS — E782 Mixed hyperlipidemia: Secondary | ICD-10-CM | POA: Diagnosis not present

## 2017-12-03 DIAGNOSIS — Z0000 Encounter for general adult medical examination without abnormal findings: Secondary | ICD-10-CM | POA: Diagnosis not present

## 2017-12-03 DIAGNOSIS — C50911 Malignant neoplasm of unspecified site of right female breast: Secondary | ICD-10-CM | POA: Diagnosis not present

## 2017-12-03 DIAGNOSIS — M810 Age-related osteoporosis without current pathological fracture: Secondary | ICD-10-CM | POA: Diagnosis not present

## 2017-12-03 DIAGNOSIS — R5383 Other fatigue: Secondary | ICD-10-CM | POA: Diagnosis not present

## 2017-12-07 DIAGNOSIS — Z124 Encounter for screening for malignant neoplasm of cervix: Secondary | ICD-10-CM | POA: Diagnosis not present

## 2017-12-07 DIAGNOSIS — Z01419 Encounter for gynecological examination (general) (routine) without abnormal findings: Secondary | ICD-10-CM | POA: Diagnosis not present
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
## 2018-04-03 ENCOUNTER — Emergency Department (HOSPITAL_COMMUNITY)
Admission: EM | Admit: 2018-04-03 | Discharge: 2018-04-03 | Disposition: A | Discharge status: Home / Self Care | Payer: PPO | Attending: Emergency Medicine | Admitting: Emergency Medicine

## 2018-04-03 ENCOUNTER — Encounter (HOSPITAL_COMMUNITY): Payer: Self-pay | Admitting: Emergency Medicine

## 2018-04-03 ENCOUNTER — Emergency Department (HOSPITAL_COMMUNITY): Payer: PPO

## 2018-04-03 DIAGNOSIS — Y999 Unspecified external cause status: Secondary | ICD-10-CM | POA: Insufficient documentation

## 2018-04-03 DIAGNOSIS — Y929 Unspecified place or not applicable: Secondary | ICD-10-CM | POA: Insufficient documentation

## 2018-04-03 DIAGNOSIS — Y939 Activity, unspecified: Secondary | ICD-10-CM | POA: Diagnosis not present

## 2018-04-03 DIAGNOSIS — S61012A Laceration without foreign body of left thumb without damage to nail, initial encounter: Secondary | ICD-10-CM | POA: Insufficient documentation

## 2018-04-03 DIAGNOSIS — Z87891 Personal history of nicotine dependence: Secondary | ICD-10-CM | POA: Insufficient documentation

## 2018-04-03 DIAGNOSIS — S6992XA Unspecified injury of left wrist, hand and finger(s), initial encounter: Secondary | ICD-10-CM | POA: Diagnosis present

## 2018-04-03 DIAGNOSIS — W274XXA Contact with kitchen utensil, initial encounter: Secondary | ICD-10-CM | POA: Diagnosis not present

## 2018-04-03 MED ORDER — PENTAFLUOROPROP-TETRAFLUOROETH EX AERO
INHALATION_SPRAY | CUTANEOUS | Status: DC | PRN
  Administered 2018-04-03: 30 via TOPICAL
  Filled 2018-04-03: qty 116

## 2018-04-03 MED ORDER — CEPHALEXIN 500 MG PO CAPS
500.0000 mg | ORAL_CAPSULE | Freq: Once | ORAL | Status: AC
  Administered 2018-04-03: 500 mg via ORAL
  Filled 2018-04-03: qty 1

## 2018-04-03 MED ORDER — LIDOCAINE HCL (PF) 1 % IJ SOLN
5.0000 mL | Freq: Once | INTRAMUSCULAR | Status: AC
  Administered 2018-04-03: 5 mL
  Filled 2018-04-03: qty 30

## 2018-04-03 MED ORDER — CEPHALEXIN 500 MG PO CAPS: 500.0000 mg | ORAL_CAPSULE | Freq: Three times a day (TID) | ORAL | 0 refills | Status: AC
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
## 2018-04-03 NOTE — Discharge Instructions (Addendum)
Please see the information and instructions below regarding your visit.  Your diagnoses today include:  1. Laceration of left thumb without foreign body without damage to nail, initial encounter     Tests performed today include: X-ray of the affected area that did not show any foreign bodies or broken bones Vital signs. See below for your results today.   Medications prescribed:   Take any prescribed medications only as directed.  Tylenol 650 mg every 6 hours for pain. Do not exceed 4000 mg in one day.  Home care instructions:  Follow any educational materials and wound care instructions contained in this packet.   Keep affected area above the level of your heart when possible to minimize swelling. Wash area gently twice a day with warm soapy water. Do not apply alcohol or hydrogen peroxide directly over a wound. Cover the area if it is draining or weeping. Keep the bandage in place for 24 hours and refrain from getting the wound wet for 24 hours. After that, you may get the area wet, but please ensure that you dry it completely afterwards.  Please use the splint with the bandage for 2 days.  After that, he likely will not need the splint.  Please refrain from soaking sutures for long periods of time, or swimming in chlorinated water   You may apply antibiotic ointment such as Bacitracin or Neosporin.  Follow-up instructions: Suture Removal: Return to the Emergency Department or see your primary care care doctor in 7-10  days for a recheck of your wound and removal of your sutures or staples.    Return instructions:  Return to the Emergency Department if you have: Fever Worsening pain Worsening swelling of the wound Pus draining from the wound Redness of the skin that moves away from the wound, especially if it streaks away from the affected area  Any other emergent concerns  Your vital signs today were: BP 128/80 (BP Location: Left Arm)    Pulse 71    Temp 98 F (36.7 C)  (Oral)    Resp 18    SpO2 100%  If your blood pressure (BP) was elevated on multiple readings during this visit above 130 for the top number or above 80 for the bottom number, please have this repeated by your primary care provider within one month. --------------  Thank you for allowing [place] to participate in your care today! It was a pleasure taking care of you.

## 2018-04-03 NOTE — ED Provider Notes (Signed)
[facility] DEPT Provider Note   CSN: [id number] Arrival date & time: 04/03/18  1730     History   Chief Complaint Chief Complaint  Patient presents with  . Finger Injury    HPI Sarah Lester is a 72 y.o. female.  HPI  Patient is a 72 year old female with a history of breast cancer, osteoarthritis presenting for laceration to the left thumb.  Patient reports that she was opening a can with a can opener, when the edge of the can cut the radial side of her left thumb.  Patient denies loss of sensation or loss of use of the thumb.  Bleeding was controlled at the scene.  Tetanus shot up-to-date 2 years ago.  Patient does not take any antiplatelets or anti-coagulation.  Past Medical History:  Diagnosis Date  . Arthritis   . 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 Complication of anesthesia    long time to wake up  . PONV (postoperative nausea and vomiting)     Patient Active Problem List   Diagnosis Date Noted  . Constipation 08/24/2013  . Acute blood loss anemia 08/24/2013  . OA (osteoarthritis) of hip 08/18/2013  . Breast cancer, right breast ([place]) 06/03/2012    Past Surgical History:  Procedure Laterality Date  . AXILLARY SURGERY  2011   cancer/radiation Tx  . BREAST SURGERY Right 2011   lumpectomy  . [place]  . COLONOSCOPY  2013  . EYE SURGERY    . TONSILLECTOMY     age 41  . TOTAL HIP ARTHROPLASTY Left 08/18/2013   Procedure: LEFT TOTAL HIP ARTHROPLASTY ANTERIOR APPROACH;  Surgeon: Gearlean Alf, MD;  Location: [facility];  Service: Orthopedics;  Laterality: Left;     OB History   None      Home Medications    Prior to Admission medications   Medication Sig Start Date End Date Taking? Authorizing Provider  Calcium 250 MG CAPS Take by mouth.    [provider]  cholecalciferol (VITAMIN D) 1000 UNITS tablet Take 1,000 Units by mouth daily. Reported on 10/24/2015    [provider]    glucosamine-chondroitin (MAX GLUCOSAMINE CHONDROITIN) 500-400 MG tablet Take 1 tablet by mouth 3 (three) times daily. Patient taking differently: Take 1 tablet by mouth 3 (three) times daily.  10/24/14   Nicholas Lose, MD    Family History Family History  Problem Relation Age of Onset  . Alzheimer's disease Mother   . Colon cancer Neg Hx   . Esophageal cancer Neg Hx   . Rectal cancer Neg Hx   . Stomach cancer Neg Hx     Social History Social History   Tobacco Use  . Smoking status: Former Research scientist (life sciences)  . Smokeless tobacco: Never Used  Substance Use Topics  . Alcohol use: Yes    Alcohol/week: 14.0 standard drinks    Types: 14 Glasses of wine per week  . Drug use: No     Allergies   Patient has no known allergies.   Review of Systems Review of Systems  Musculoskeletal: Positive for arthralgias.  Skin: Positive for wound. Negative for color change.  Neurological: Negative for weakness and numbness.     Physical Exam Updated Vital Signs BP 128/80 (BP Location: Left Arm)   Pulse 71   Temp 98 F (36.7 C) (Oral)   Resp 18   SpO2 100%   Physical Exam  Constitutional: She appears well-developed and well-nourished. No distress.  Sitting comfortably in bed.  HENT:  Head: Normocephalic and atraumatic.  Eyes: Conjunctivae are normal. Right eye exhibits no discharge. Left eye exhibits no discharge.  EOMs normal to gross examination.  Neck: Normal range of motion.  Cardiovascular: Normal rate and regular rhythm.  Intact, 2+ radial pulse of LUE.   Pulmonary/Chest:  Normal respiratory effort. Patient converses comfortably. No audible wheeze or stridor.  Abdominal: She exhibits no distension.  Musculoskeletal:  See clinical photo for details.  Patient is a 2 cm laceration along the radial aspect of the left thumb.  No tendon exposure.  No bone exposure. Patient has full capacity of flexion and extension of MCP and IP joint of the left thumb.  Patient reports slight decreased  sensation to sharp touch just distal to the laceration, but has full sensation distally.  Patient denies any loss of sensation with examination.  Neurological: She is alert.  Cranial nerves intact to gross observation. Patient moves extremities without difficulty.  Skin: Skin is warm and dry. She is not diaphoretic.  Psychiatric: She has a normal mood and affect. Her behavior is normal. Judgment and thought content normal.  Nursing note and vitals reviewed.    ED Treatments / Results  Labs (all labs ordered are listed, but only abnormal results are displayed) Labs Reviewed - No data to display  EKG None  Radiology Dg Finger Thumb Left  Result Date: 04/03/2018 CLINICAL DATA:  Distal thumb laceration EXAM: LEFT THUMB 2+V COMPARISON:  None. FINDINGS: No fracture or dislocation is seen. Mild degenerative changes of the 1st IP joint. Soft tissue laceration along the ulnar/ventral aspect of the distal phalanx. No radiopaque foreign body is seen. IMPRESSION: Tissue laceration along the ulnar/ventral aspect of the 1st distal phalanx. No fracture, dislocation, or radiopaque foreign body is seen. Electronically Signed   By: Julian Hy M.D.   On: 04/03/2018 19:09    Procedures .Marland KitchenLaceration Repair Date/Time: 04/03/2018 8:57 PM Performed by: Albesa Seen, PA-C Authorized by: Albesa Seen, PA-C   Consent:    Consent obtained:  Verbal   Consent given by:  Patient   Risks discussed:  Infection, pain and need for additional repair Anesthesia (see MAR for exact dosages):    Anesthesia method:  Local infiltration   Local anesthetic:  Lidocaine 1% w/o epi Laceration details:    Location:  Finger   Finger location:  L thumb   Length (cm):  2 Repair type:    Repair type:  Simple Pre-procedure details:    Preparation:  Patient was prepped and draped in usual sterile fashion Exploration:    Hemostasis achieved with:  Direct pressure   Wound exploration: wound explored through  full range of motion     Wound extent: no foreign bodies/material noted, no muscle damage noted, no tendon damage noted and no underlying fracture noted     Contaminated: no   Treatment:    Area cleansed with:  Betadine   Amount of cleaning:  Standard   Irrigation solution:  Sterile saline   Irrigation volume:  750 ml   Irrigation method:  Syringe   Visualized foreign bodies/material removed: no   Skin repair:    Repair method:  Sutures   Suture size:  4-0   Suture material:  Nylon   Suture technique:  Simple interrupted   Number of sutures:  4 Approximation:    Approximation:  Close Post-procedure details:    Dressing:  Non-adherent dressing   Patient tolerance of procedure:  Tolerated well,  no immediate complications   (including critical care time)  Medications Ordered in ED Medications  lidocaine (PF) (XYLOCAINE) 1 % injection 5 mL (has no administration in time range)  pentafluoroprop-tetrafluoroeth (GEBAUERS) aerosol (has no administration in time range)  cephALEXin (KEFLEX) capsule 500 mg (has no administration in time range)     Initial Impression / Assessment and Plan / ED Course  I have reviewed the triage vital signs and the nursing notes.  Pertinent labs & imaging results that were available during my care of the patient were reviewed by me and considered in my medical decision making (see chart for details).    Patient is well-appearing and in no acute distress.  Patient neurovascularly intact in the thumb of the left upper extremity.  2 cm laceration repaired primarily.  Patient tolerated procedure well.  Patient was given proper wound care instructions including bandage removed in 24 hours, suture removal in 7 to 10 days, and proper cleaning.  Return precautions given for any swelling, drainage, redness, or pain.  Recommended Tylenol for pain.  Keflex for prophylaxis.  Patient is in understanding and agrees with plan of care.  This is a shared visit with Dr.  Charlesetta Shanks. Patient was independently evaluated by this attending physician. Attending physician consulted in evaluation and discharge management.   Final Clinical Impressions(s) / ED Diagnoses   Final diagnoses:  Laceration of left thumb without foreign body without damage to nail, initial encounter    ED Discharge Orders    None       Tamala Julian 04/03/18 2105    Charlesetta Shanks, MD 04/04/18 [phone]

## 2018-04-22 DIAGNOSIS — J0100 Acute maxillary sinusitis, unspecified: Secondary | ICD-10-CM | POA: Diagnosis not present

## 2018-04-22 DIAGNOSIS — M810 Age-related osteoporosis without current pathological fracture: Secondary | ICD-10-CM | POA: Diagnosis not present

## 2018-04-22 DIAGNOSIS — R062 Wheezing: Secondary | ICD-10-CM | POA: Diagnosis not present

## 2018-04-22 DIAGNOSIS — R05 Cough: Secondary | ICD-10-CM | POA: Diagnosis not present

## 2018-06-01 DIAGNOSIS — H1013 Acute atopic conjunctivitis, bilateral: Secondary | ICD-10-CM | POA: Diagnosis not present

## 2018-07-26 DIAGNOSIS — L309 Dermatitis, unspecified: Secondary | ICD-10-CM | POA: Diagnosis not present

## 2018-08-20 DIAGNOSIS — S5002XA Contusion of left elbow, initial encounter: Secondary | ICD-10-CM | POA: Diagnosis not present

## 2018-08-23 DIAGNOSIS — M25522 Pain in left elbow: Secondary | ICD-10-CM | POA: Diagnosis not present

## 2018-11-10 DIAGNOSIS — Z1231 Encounter for screening mammogram for malignant neoplasm of breast: Secondary | ICD-10-CM | POA: Diagnosis not present

## 2018-12-01 DIAGNOSIS — H40053 Ocular hypertension, bilateral: Secondary | ICD-10-CM | POA: Diagnosis not present

## 2018-12-09 DIAGNOSIS — Z0000 Encounter for general adult medical examination without abnormal findings: Secondary | ICD-10-CM | POA: Diagnosis not present

## 2018-12-09 DIAGNOSIS — E782 Mixed hyperlipidemia: Secondary | ICD-10-CM | POA: Diagnosis not present

## 2018-12-21 DIAGNOSIS — Z01419 Encounter for gynecological examination (general) (routine) without abnormal findings: Secondary | ICD-10-CM | POA: Diagnosis not present

## 2018-12-23 DIAGNOSIS — M810 Age-related osteoporosis without current pathological fracture: Secondary | ICD-10-CM | POA: Diagnosis not present

## 2018-12-23 DIAGNOSIS — R5383 Other fatigue: Secondary | ICD-10-CM | POA: Diagnosis not present

## 2018-12-23 DIAGNOSIS — Z7189 Other specified counseling: Secondary | ICD-10-CM | POA: Diagnosis not present

## 2018-12-23 DIAGNOSIS — C50911 Malignant neoplasm of unspecified site of right female breast: Secondary | ICD-10-CM | POA: Diagnosis not present

## 2018-12-23 DIAGNOSIS — E782 Mixed hyperlipidemia: Secondary | ICD-10-CM | POA: Diagnosis not present

## 2018-12-23 DIAGNOSIS — M7712 Lateral epicondylitis, left elbow: Secondary | ICD-10-CM | POA: Diagnosis not present

## 2018-12-23 DIAGNOSIS — Z0000 Encounter for general adult medical examination without abnormal findings: Secondary | ICD-10-CM | POA: Diagnosis not present

## 2018-12-26 DIAGNOSIS — Z853 Personal history of malignant neoplasm of breast: Secondary | ICD-10-CM | POA: Diagnosis not present

## 2018-12-26 DIAGNOSIS — M810 Age-related osteoporosis without current pathological fracture: Secondary | ICD-10-CM | POA: Diagnosis not present

## 2018-12-26 DIAGNOSIS — M419 Scoliosis, unspecified: Secondary | ICD-10-CM | POA: Diagnosis not present

## 2018-12-26 DIAGNOSIS — Z96642 Presence of left artificial hip joint: Secondary | ICD-10-CM | POA: Diagnosis not present

## 2019-09-12 ENCOUNTER — Ambulatory Visit: Payer: PPO | Admitting: Dermatology

## 2019-09-12 ENCOUNTER — Other Ambulatory Visit: Payer: Self-pay

## 2019-09-12 ENCOUNTER — Encounter: Payer: Self-pay | Admitting: Dermatology

## 2019-09-12 DIAGNOSIS — L821 Other seborrheic keratosis: Secondary | ICD-10-CM | POA: Diagnosis not present

## 2019-09-12 DIAGNOSIS — D1801 Hemangioma of skin and subcutaneous tissue: Secondary | ICD-10-CM | POA: Diagnosis not present

## 2019-09-12 DIAGNOSIS — D225 Melanocytic nevi of trunk: Secondary | ICD-10-CM | POA: Diagnosis not present

## 2019-09-12 DIAGNOSIS — D229 Melanocytic nevi, unspecified: Secondary | ICD-10-CM

## 2019-09-12 NOTE — Patient Instructions (Addendum)
Sun exposed areas plus back examined in Sarah Lester.  There are no atypical moles, skin cancer, or significant precancers.  She did try using Tolak cream on the new spots on the forehead with no success.  Examination shows a 5 mm textured brown spot on the right outer brow and a cluster of four 2 to 1mm tan gold spots on the left outer forehead.  These represent benign or seborrheic keratoses and are of no potential medical consequences.  Unfortunately there is no topical therapy that makes these go away; if these are bothersome, in the late fall or winter will further discuss simple freezing versus shave removal.  The rest of her skin exam showed no worrisome spots there are multiple red dots on the back and elsewhere on the torso which are cherry angiomas.  A darker more purple spot on the left forearm is a variation of the same.  Follow-up in the late fall or winter.

## 2019-09-12 NOTE — Progress Notes (Signed)
   Follow-Up Visit   Subjective  Sarah Lester is a 74 y.o. female who presents for the following: Skin Problem (left temple-scaly x months, right eyebrow-rough x months).  Growths Location: Face Duration:  Quality:  Associated Signs/Symptoms: Modifying Factors:  Severity:  Timing: Context:   The following portions of the chart were reviewed this encounter and updated as appropriate: Tobacco  Allergies  Meds  Problems  Med Hx  Surg Hx  Fam Hx      Objective  Well appearing patient in no apparent distress; mood and affect are within normal limits.  All sun exposed areas and back examined.   Assessment & Plan  Nevus Mid Back  Annual skin examination  Seborrheic keratosis (2) Right Eyebrow; Left [name] exposed areas plus back examined in [name].  There are no atypical moles, skin cancer, or significant precancers.  She did try using Tolak cream on the new spots on the forehead with no success.  Examination shows a 5 mm textured brown spot on the right outer brow and a cluster of four 2 to 47mm tan gold spots on the left outer forehead.  These represent benign or seborrheic keratoses and are of no potential medical consequences.  Unfortunately there is no topical therapy that makes these go away; if these are bothersome, in the late fall or winter will further discuss simple freezing versus shave removal.  The rest of her skin exam showed no worrisome spots there are multiple red dots on the back and elsewhere on the torso which are cherry angiomas.  A darker more purple spot on the left forearm is a variation of the same.  Follow-up in the late fall or winter.

## 2019-09-16 ENCOUNTER — Encounter: Payer: Self-pay | Admitting: Dermatology

## 2019-11-13 DIAGNOSIS — Z1231 Encounter for screening mammogram for malignant neoplasm of breast: Secondary | ICD-10-CM | POA: Diagnosis not present

## 2019-12-05 DIAGNOSIS — H524 Presbyopia: Secondary | ICD-10-CM | POA: Diagnosis not present

## 2019-12-05 DIAGNOSIS — Z961 Presence of intraocular lens: Secondary | ICD-10-CM | POA: Diagnosis not present

## 2019-12-05 DIAGNOSIS — H40053 Ocular hypertension, bilateral: Secondary | ICD-10-CM | POA: Diagnosis not present

## 2019-12-05 DIAGNOSIS — H40013 Open angle with borderline findings, low risk, bilateral: Secondary | ICD-10-CM | POA: Diagnosis not present

## 2019-12-18 DIAGNOSIS — Z23 Encounter for immunization: Secondary | ICD-10-CM | POA: Diagnosis not present

## 2019-12-29 DIAGNOSIS — R5383 Other fatigue: Secondary | ICD-10-CM | POA: Diagnosis not present

## 2019-12-29 DIAGNOSIS — M810 Age-related osteoporosis without current pathological fracture: Secondary | ICD-10-CM | POA: Diagnosis not present

## 2019-12-29 DIAGNOSIS — E782 Mixed hyperlipidemia: Secondary | ICD-10-CM | POA: Diagnosis not present

## 2020-01-05 DIAGNOSIS — E559 Vitamin D deficiency, unspecified: Secondary | ICD-10-CM | POA: Diagnosis not present

## 2020-01-05 DIAGNOSIS — Z853 Personal history of malignant neoplasm of breast: Secondary | ICD-10-CM | POA: Diagnosis not present

## 2020-01-05 DIAGNOSIS — Z0000 Encounter for general adult medical examination without abnormal findings: Secondary | ICD-10-CM | POA: Diagnosis not present

## 2020-01-05 DIAGNOSIS — M419 Scoliosis, unspecified: Secondary | ICD-10-CM | POA: Diagnosis not present

## 2020-01-05 DIAGNOSIS — M810 Age-related osteoporosis without current pathological fracture: Secondary | ICD-10-CM | POA: Diagnosis not present

## 2020-02-05 DIAGNOSIS — Z961 Presence of intraocular lens: Secondary | ICD-10-CM | POA: Diagnosis not present

## 2020-02-05 DIAGNOSIS — H26491 Other secondary cataract, right eye: Secondary | ICD-10-CM | POA: Diagnosis not present

## 2020-02-20 DIAGNOSIS — H26491 Other secondary cataract, right eye: Secondary | ICD-10-CM | POA: Diagnosis not present

## 2020-05-16 IMAGING — CR DG FINGER THUMB 2+V*L*
3 series · 3 of 3 positions shown · non-contrast
Comparison: None.

CLINICAL DATA: Distal thumb laceration

EXAM:
LEFT THUMB 2+V

[x finger obl left]
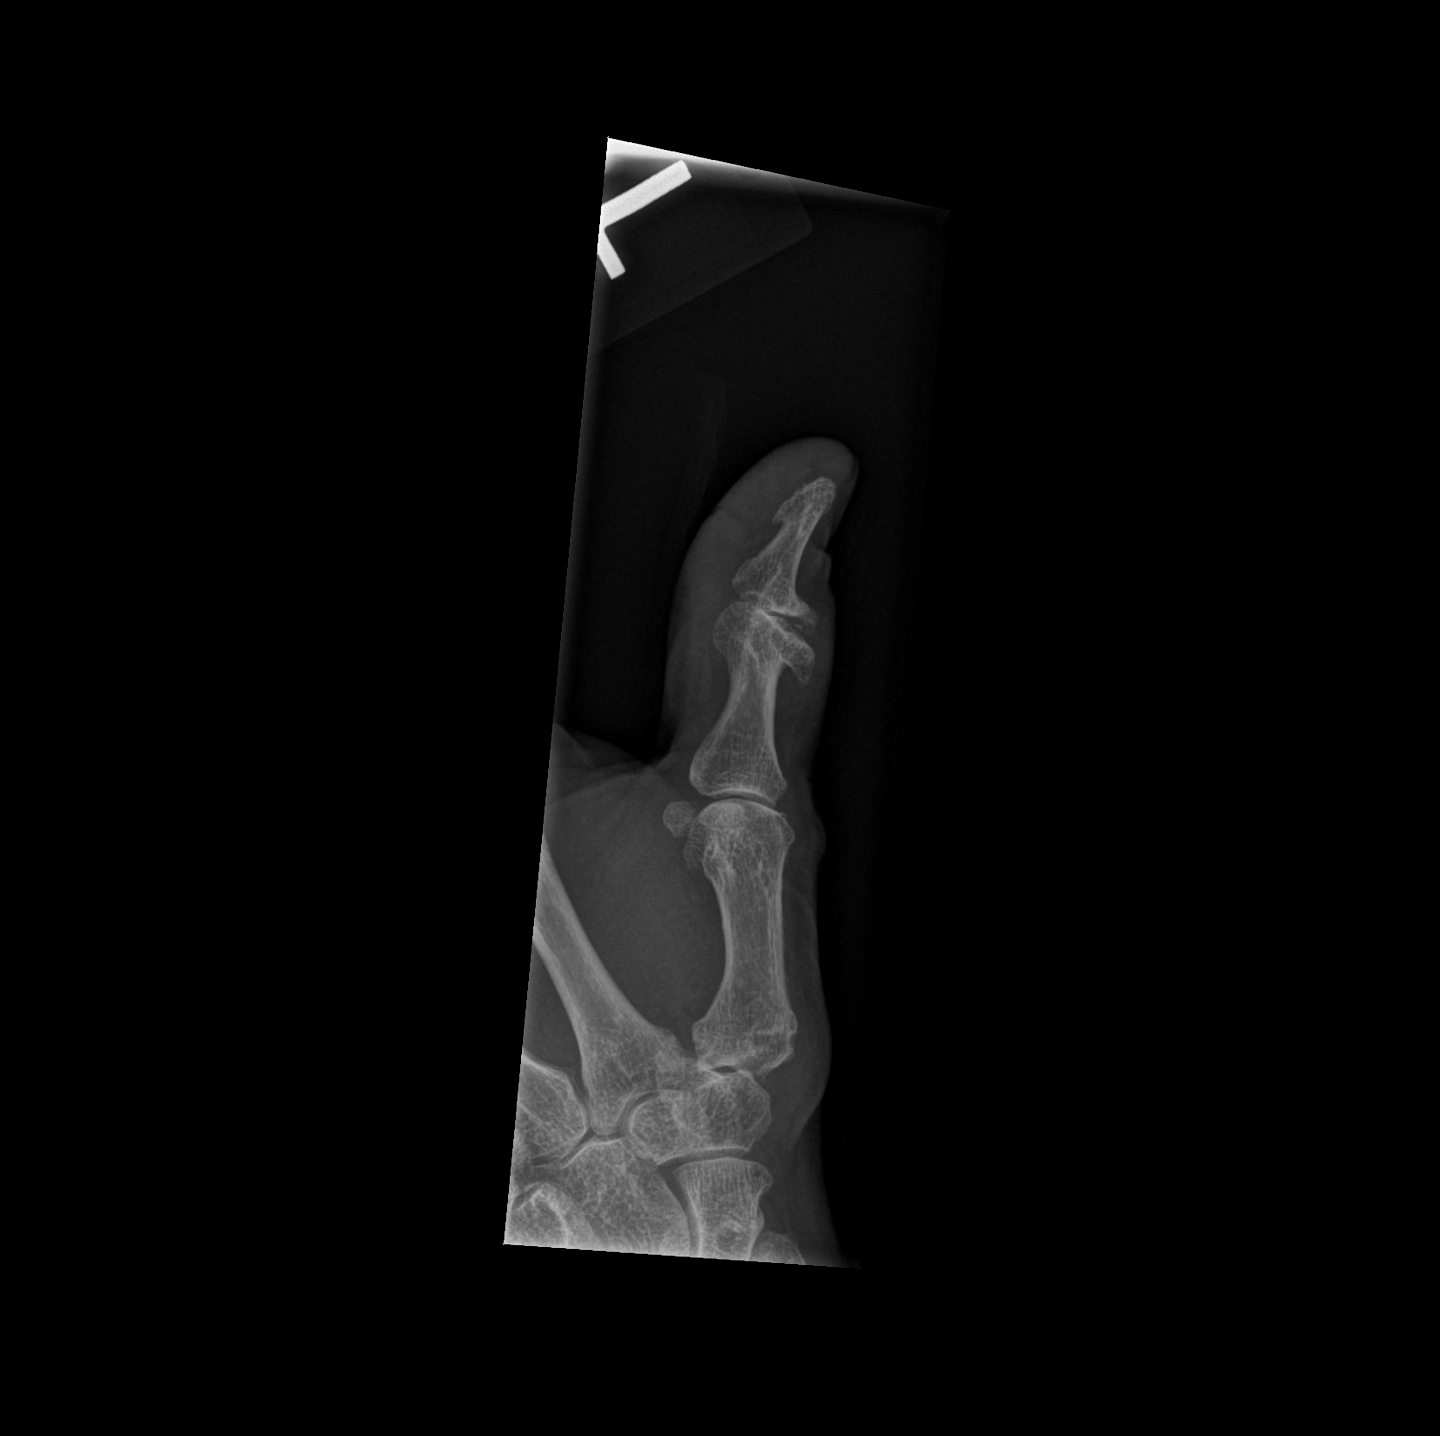

[x finger lat left]
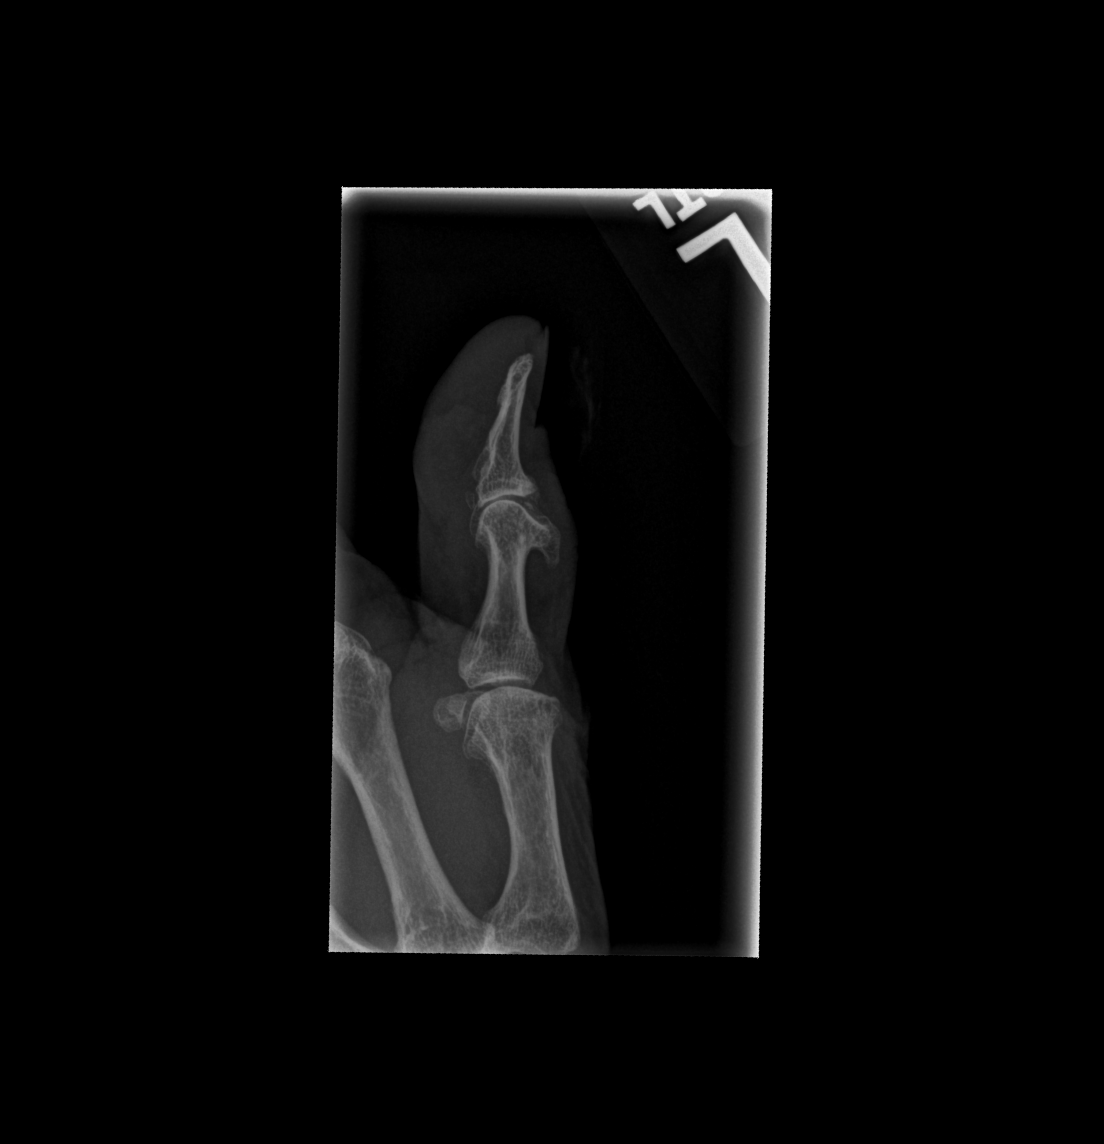

[x finger pa left]
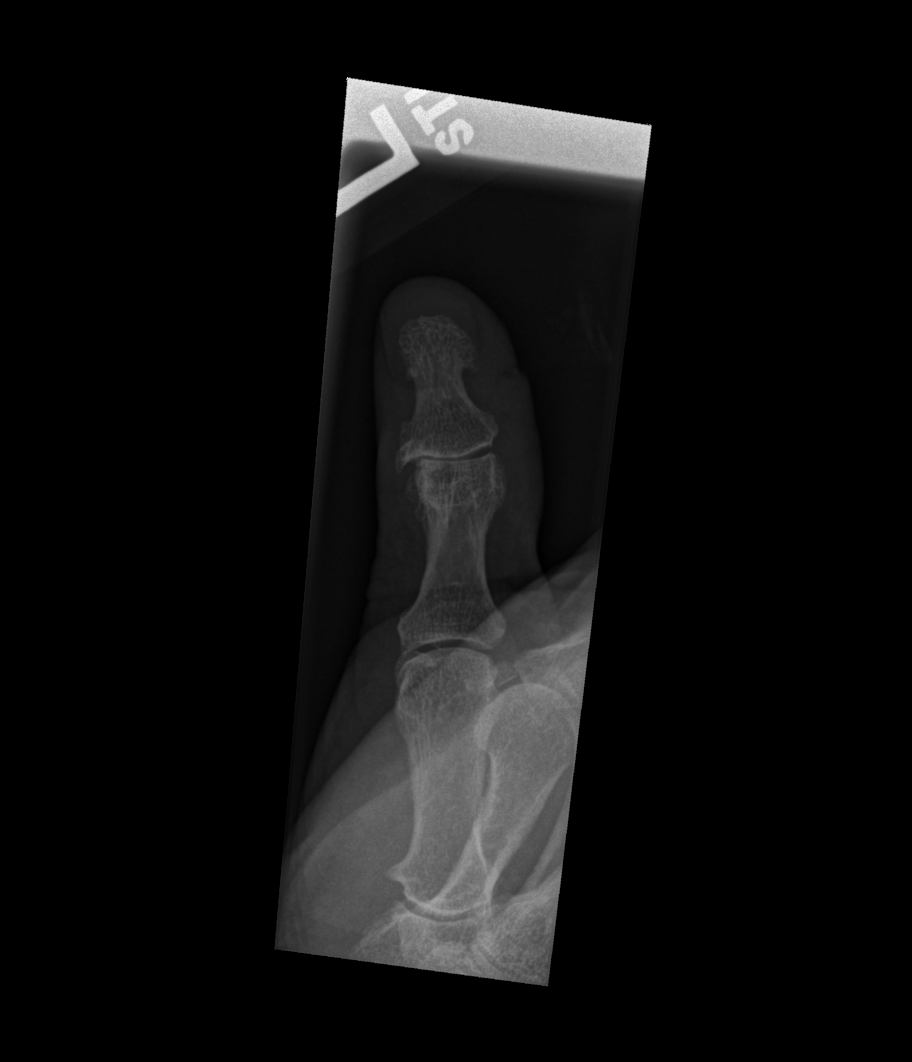

[3 of 3 positions shown; findings below may reference images not displayed]

FINDINGS: No fracture or dislocation is seen.

Mild degenerative changes of the 1st IP joint.

Soft tissue laceration along the ulnar/ventral aspect of the distal
phalanx.

No radiopaque foreign body is seen.
IMPRESSION: Tissue laceration along the ulnar/ventral aspect of the 1st distal
phalanx.

No fracture, dislocation, or radiopaque foreign body is seen.

## 2020-07-17 DIAGNOSIS — R309 Painful micturition, unspecified: Secondary | ICD-10-CM | POA: Diagnosis not present

## 2020-07-24 DIAGNOSIS — N898 Other specified noninflammatory disorders of vagina: Secondary | ICD-10-CM | POA: Diagnosis not present

## 2020-07-24 DIAGNOSIS — B373 Candidiasis of vulva and vagina: Secondary | ICD-10-CM | POA: Diagnosis not present

## 2020-11-14 DIAGNOSIS — Z1231 Encounter for screening mammogram for malignant neoplasm of breast: Secondary | ICD-10-CM | POA: Diagnosis not present

## 2020-12-31 DIAGNOSIS — H40053 Ocular hypertension, bilateral: Secondary | ICD-10-CM | POA: Diagnosis not present

## 2020-12-31 DIAGNOSIS — H26492 Other secondary cataract, left eye: Secondary | ICD-10-CM | POA: Diagnosis not present

## 2021-01-07 DIAGNOSIS — N390 Urinary tract infection, site not specified: Secondary | ICD-10-CM | POA: Diagnosis not present

## 2021-01-07 DIAGNOSIS — Z0000 Encounter for general adult medical examination without abnormal findings: Secondary | ICD-10-CM | POA: Diagnosis not present

## 2021-01-07 DIAGNOSIS — E559 Vitamin D deficiency, unspecified: Secondary | ICD-10-CM | POA: Diagnosis not present

## 2021-01-07 DIAGNOSIS — E782 Mixed hyperlipidemia: Secondary | ICD-10-CM | POA: Diagnosis not present

## 2021-01-10 DIAGNOSIS — R7989 Other specified abnormal findings of blood chemistry: Secondary | ICD-10-CM | POA: Diagnosis not present

## 2021-01-10 DIAGNOSIS — Z0000 Encounter for general adult medical examination without abnormal findings: Secondary | ICD-10-CM | POA: Diagnosis not present

## 2021-01-10 DIAGNOSIS — R7309 Other abnormal glucose: Secondary | ICD-10-CM | POA: Diagnosis not present

## 2021-01-10 DIAGNOSIS — E559 Vitamin D deficiency, unspecified: Secondary | ICD-10-CM | POA: Diagnosis not present

## 2021-04-11 DIAGNOSIS — M1811 Unilateral primary osteoarthritis of first carpometacarpal joint, right hand: Secondary | ICD-10-CM | POA: Diagnosis not present

## 2021-04-11 DIAGNOSIS — M79641 Pain in right hand: Secondary | ICD-10-CM | POA: Diagnosis not present

## 2021-04-15 DIAGNOSIS — M79641 Pain in right hand: Secondary | ICD-10-CM | POA: Diagnosis not present

## 2021-04-15 DIAGNOSIS — M1811 Unilateral primary osteoarthritis of first carpometacarpal joint, right hand: Secondary | ICD-10-CM | POA: Diagnosis not present

## 2021-05-15 DIAGNOSIS — S61411A Laceration without foreign body of right hand, initial encounter: Secondary | ICD-10-CM | POA: Diagnosis not present

## 2021-07-09 DIAGNOSIS — M1811 Unilateral primary osteoarthritis of first carpometacarpal joint, right hand: Secondary | ICD-10-CM | POA: Diagnosis not present

## 2021-08-14 DIAGNOSIS — Z96642 Presence of left artificial hip joint: Secondary | ICD-10-CM | POA: Diagnosis not present

## 2021-08-21 DIAGNOSIS — H04123 Dry eye syndrome of bilateral lacrimal glands: Secondary | ICD-10-CM | POA: Diagnosis not present

## 2021-08-21 DIAGNOSIS — H10412 Chronic giant papillary conjunctivitis, left eye: Secondary | ICD-10-CM | POA: Diagnosis not present

## 2021-11-20 DIAGNOSIS — Z1231 Encounter for screening mammogram for malignant neoplasm of breast: Secondary | ICD-10-CM | POA: Diagnosis not present

## 2021-11-26 DIAGNOSIS — R922 Inconclusive mammogram: Secondary | ICD-10-CM | POA: Diagnosis not present

## 2021-11-26 DIAGNOSIS — R928 Other abnormal and inconclusive findings on diagnostic imaging of breast: Secondary | ICD-10-CM | POA: Diagnosis not present

## 2021-12-01 DIAGNOSIS — M419 Scoliosis, unspecified: Secondary | ICD-10-CM | POA: Diagnosis not present

## 2021-12-01 DIAGNOSIS — M5430 Sciatica, unspecified side: Secondary | ICD-10-CM | POA: Diagnosis not present

## 2022-01-09 DIAGNOSIS — M1811 Unilateral primary osteoarthritis of first carpometacarpal joint, right hand: Secondary | ICD-10-CM | POA: Diagnosis not present

## 2022-01-15 DIAGNOSIS — R7309 Other abnormal glucose: Secondary | ICD-10-CM | POA: Diagnosis not present

## 2022-01-15 DIAGNOSIS — Z0000 Encounter for general adult medical examination without abnormal findings: Secondary | ICD-10-CM | POA: Diagnosis not present

## 2022-01-15 DIAGNOSIS — E782 Mixed hyperlipidemia: Secondary | ICD-10-CM | POA: Diagnosis not present

## 2022-01-15 DIAGNOSIS — R7989 Other specified abnormal findings of blood chemistry: Secondary | ICD-10-CM | POA: Diagnosis not present

## 2022-01-16 DIAGNOSIS — M1811 Unilateral primary osteoarthritis of first carpometacarpal joint, right hand: Secondary | ICD-10-CM | POA: Diagnosis not present

## 2022-01-16 DIAGNOSIS — M25541 Pain in joints of right hand: Secondary | ICD-10-CM | POA: Diagnosis not present

## 2022-01-22 DIAGNOSIS — Z13220 Encounter for screening for lipoid disorders: Secondary | ICD-10-CM | POA: Diagnosis not present

## 2022-01-22 DIAGNOSIS — M7918 Myalgia, other site: Secondary | ICD-10-CM | POA: Diagnosis not present

## 2022-01-22 DIAGNOSIS — Z0000 Encounter for general adult medical examination without abnormal findings: Secondary | ICD-10-CM | POA: Diagnosis not present

## 2022-01-22 DIAGNOSIS — R7989 Other specified abnormal findings of blood chemistry: Secondary | ICD-10-CM | POA: Diagnosis not present

## 2022-01-22 DIAGNOSIS — R7309 Other abnormal glucose: Secondary | ICD-10-CM | POA: Diagnosis not present

## 2022-01-22 DIAGNOSIS — M419 Scoliosis, unspecified: Secondary | ICD-10-CM | POA: Diagnosis not present

## 2022-02-02 DIAGNOSIS — M62551 Muscle wasting and atrophy, not elsewhere classified, right thigh: Secondary | ICD-10-CM | POA: Diagnosis not present

## 2022-02-02 DIAGNOSIS — M25551 Pain in right hip: Secondary | ICD-10-CM | POA: Diagnosis not present

## 2022-02-09 DIAGNOSIS — M62551 Muscle wasting and atrophy, not elsewhere classified, right thigh: Secondary | ICD-10-CM | POA: Diagnosis not present

## 2022-02-09 DIAGNOSIS — M25551 Pain in right hip: Secondary | ICD-10-CM | POA: Diagnosis not present

## 2022-03-12 DIAGNOSIS — Z961 Presence of intraocular lens: Secondary | ICD-10-CM | POA: Diagnosis not present

## 2022-03-12 DIAGNOSIS — H04123 Dry eye syndrome of bilateral lacrimal glands: Secondary | ICD-10-CM | POA: Diagnosis not present

## 2022-03-12 DIAGNOSIS — H26492 Other secondary cataract, left eye: Secondary | ICD-10-CM | POA: Diagnosis not present

## 2022-06-11 DIAGNOSIS — Z01411 Encounter for gynecological examination (general) (routine) with abnormal findings: Secondary | ICD-10-CM | POA: Diagnosis not present

## 2022-06-11 DIAGNOSIS — Z124 Encounter for screening for malignant neoplasm of cervix: Secondary | ICD-10-CM | POA: Diagnosis not present

## 2022-06-11 DIAGNOSIS — Z01419 Encounter for gynecological examination (general) (routine) without abnormal findings: Secondary | ICD-10-CM | POA: Diagnosis not present

## 2022-07-08 ENCOUNTER — Encounter: Payer: Self-pay | Admitting: Gastroenterology

## 2022-11-12 DIAGNOSIS — L905 Scar conditions and fibrosis of skin: Secondary | ICD-10-CM | POA: Diagnosis not present

## 2022-11-12 DIAGNOSIS — D692 Other nonthrombocytopenic purpura: Secondary | ICD-10-CM | POA: Diagnosis not present

## 2022-11-12 DIAGNOSIS — Z85828 Personal history of other malignant neoplasm of skin: Secondary | ICD-10-CM | POA: Diagnosis not present

## 2022-11-12 DIAGNOSIS — D1801 Hemangioma of skin and subcutaneous tissue: Secondary | ICD-10-CM | POA: Diagnosis not present

## 2022-11-12 DIAGNOSIS — L821 Other seborrheic keratosis: Secondary | ICD-10-CM | POA: Diagnosis not present

## 2022-11-12 DIAGNOSIS — L814 Other melanin hyperpigmentation: Secondary | ICD-10-CM | POA: Diagnosis not present

## 2022-11-12 DIAGNOSIS — D225 Melanocytic nevi of trunk: Secondary | ICD-10-CM | POA: Diagnosis not present

## 2022-11-26 DIAGNOSIS — M8588 Other specified disorders of bone density and structure, other site: Secondary | ICD-10-CM | POA: Diagnosis not present

## 2022-11-26 DIAGNOSIS — Z1231 Encounter for screening mammogram for malignant neoplasm of breast: Secondary | ICD-10-CM | POA: Diagnosis not present

## 2022-11-26 DIAGNOSIS — Z853 Personal history of malignant neoplasm of breast: Secondary | ICD-10-CM | POA: Diagnosis not present

## 2023-01-25 DIAGNOSIS — R7989 Other specified abnormal findings of blood chemistry: Secondary | ICD-10-CM | POA: Diagnosis not present

## 2023-01-25 DIAGNOSIS — R7309 Other abnormal glucose: Secondary | ICD-10-CM | POA: Diagnosis not present

## 2023-01-25 DIAGNOSIS — Z79899 Other long term (current) drug therapy: Secondary | ICD-10-CM | POA: Diagnosis not present

## 2023-01-25 DIAGNOSIS — Z13220 Encounter for screening for lipoid disorders: Secondary | ICD-10-CM | POA: Diagnosis not present

## 2023-02-01 DIAGNOSIS — Z79899 Other long term (current) drug therapy: Secondary | ICD-10-CM | POA: Diagnosis not present

## 2023-02-01 DIAGNOSIS — Z0000 Encounter for general adult medical examination without abnormal findings: Secondary | ICD-10-CM | POA: Diagnosis not present

## 2023-02-01 DIAGNOSIS — Z853 Personal history of malignant neoplasm of breast: Secondary | ICD-10-CM | POA: Diagnosis not present

## 2023-02-01 DIAGNOSIS — R7989 Other specified abnormal findings of blood chemistry: Secondary | ICD-10-CM | POA: Diagnosis not present

## 2023-02-01 DIAGNOSIS — M810 Age-related osteoporosis without current pathological fracture: Secondary | ICD-10-CM | POA: Diagnosis not present

## 2023-02-01 DIAGNOSIS — R7309 Other abnormal glucose: Secondary | ICD-10-CM | POA: Diagnosis not present

## 2023-02-01 DIAGNOSIS — Z13220 Encounter for screening for lipoid disorders: Secondary | ICD-10-CM | POA: Diagnosis not present

## 2023-02-24 DIAGNOSIS — M810 Age-related osteoporosis without current pathological fracture: Secondary | ICD-10-CM | POA: Diagnosis not present

## 2023-02-24 DIAGNOSIS — M549 Dorsalgia, unspecified: Secondary | ICD-10-CM | POA: Diagnosis not present

## 2023-02-24 DIAGNOSIS — M419 Scoliosis, unspecified: Secondary | ICD-10-CM | POA: Diagnosis not present

## 2023-03-17 DIAGNOSIS — R239 Unspecified skin changes: Secondary | ICD-10-CM | POA: Diagnosis not present

## 2023-03-17 DIAGNOSIS — N63 Unspecified lump in unspecified breast: Secondary | ICD-10-CM | POA: Diagnosis not present

## 2023-03-17 DIAGNOSIS — Z853 Personal history of malignant neoplasm of breast: Secondary | ICD-10-CM | POA: Diagnosis not present

## 2023-03-29 DIAGNOSIS — R92321 Mammographic fibroglandular density, right breast: Secondary | ICD-10-CM | POA: Diagnosis not present

## 2023-03-29 DIAGNOSIS — N6310 Unspecified lump in the right breast, unspecified quadrant: Secondary | ICD-10-CM | POA: Diagnosis not present

## 2023-03-29 DIAGNOSIS — N6315 Unspecified lump in the right breast, overlapping quadrants: Secondary | ICD-10-CM | POA: Diagnosis not present

## 2023-03-29 DIAGNOSIS — Z853 Personal history of malignant neoplasm of breast: Secondary | ICD-10-CM | POA: Diagnosis not present

## 2023-06-21 DIAGNOSIS — Z1331 Encounter for screening for depression: Secondary | ICD-10-CM | POA: Diagnosis not present

## 2023-06-21 DIAGNOSIS — Z01419 Encounter for gynecological examination (general) (routine) without abnormal findings: Secondary | ICD-10-CM | POA: Diagnosis not present

## 2023-07-05 DIAGNOSIS — Z961 Presence of intraocular lens: Secondary | ICD-10-CM | POA: Diagnosis not present

## 2023-07-05 DIAGNOSIS — H26492 Other secondary cataract, left eye: Secondary | ICD-10-CM | POA: Diagnosis not present

## 2023-07-05 DIAGNOSIS — H401231 Low-tension glaucoma, bilateral, mild stage: Secondary | ICD-10-CM | POA: Diagnosis not present

## 2023-08-19 DIAGNOSIS — H26492 Other secondary cataract, left eye: Secondary | ICD-10-CM | POA: Diagnosis not present

## 2023-08-23 DIAGNOSIS — H40013 Open angle with borderline findings, low risk, bilateral: Secondary | ICD-10-CM | POA: Diagnosis not present

## 2023-09-23 DIAGNOSIS — H903 Sensorineural hearing loss, bilateral: Secondary | ICD-10-CM | POA: Diagnosis not present

## 2023-09-23 DIAGNOSIS — H6122 Impacted cerumen, left ear: Secondary | ICD-10-CM | POA: Diagnosis not present

## 2023-09-23 DIAGNOSIS — J300 Vasomotor rhinitis: Secondary | ICD-10-CM | POA: Diagnosis not present

## 2023-10-04 DIAGNOSIS — M47819 Spondylosis without myelopathy or radiculopathy, site unspecified: Secondary | ICD-10-CM | POA: Diagnosis not present

## 2023-10-27 DIAGNOSIS — L821 Other seborrheic keratosis: Secondary | ICD-10-CM | POA: Diagnosis not present

## 2023-10-27 DIAGNOSIS — D1801 Hemangioma of skin and subcutaneous tissue: Secondary | ICD-10-CM | POA: Diagnosis not present

## 2023-10-27 DIAGNOSIS — Z1283 Encounter for screening for malignant neoplasm of skin: Secondary | ICD-10-CM | POA: Diagnosis not present

## 2023-12-02 DIAGNOSIS — Z1231 Encounter for screening mammogram for malignant neoplasm of breast: Secondary | ICD-10-CM | POA: Diagnosis not present

## 2023-12-06 DIAGNOSIS — H903 Sensorineural hearing loss, bilateral: Secondary | ICD-10-CM | POA: Diagnosis not present

## 2024-02-09 DIAGNOSIS — M810 Age-related osteoporosis without current pathological fracture: Secondary | ICD-10-CM | POA: Diagnosis not present

## 2024-02-09 DIAGNOSIS — Z79899 Other long term (current) drug therapy: Secondary | ICD-10-CM | POA: Diagnosis not present

## 2024-02-09 DIAGNOSIS — Z13220 Encounter for screening for lipoid disorders: Secondary | ICD-10-CM | POA: Diagnosis not present

## 2024-02-09 DIAGNOSIS — R7309 Other abnormal glucose: Secondary | ICD-10-CM | POA: Diagnosis not present

## 2024-02-09 DIAGNOSIS — R7989 Other specified abnormal findings of blood chemistry: Secondary | ICD-10-CM | POA: Diagnosis not present

## 2024-02-10 DIAGNOSIS — Z961 Presence of intraocular lens: Secondary | ICD-10-CM | POA: Diagnosis not present

## 2024-02-10 DIAGNOSIS — H40013 Open angle with borderline findings, low risk, bilateral: Secondary | ICD-10-CM | POA: Diagnosis not present

## 2024-02-10 DIAGNOSIS — H04123 Dry eye syndrome of bilateral lacrimal glands: Secondary | ICD-10-CM | POA: Diagnosis not present

## 2024-02-16 DIAGNOSIS — E559 Vitamin D deficiency, unspecified: Secondary | ICD-10-CM | POA: Diagnosis not present

## 2024-02-16 DIAGNOSIS — R7309 Other abnormal glucose: Secondary | ICD-10-CM | POA: Diagnosis not present

## 2024-02-16 DIAGNOSIS — M419 Scoliosis, unspecified: Secondary | ICD-10-CM | POA: Diagnosis not present

## 2024-02-16 DIAGNOSIS — M5430 Sciatica, unspecified side: Secondary | ICD-10-CM | POA: Diagnosis not present

## 2024-02-16 DIAGNOSIS — Z0000 Encounter for general adult medical examination without abnormal findings: Secondary | ICD-10-CM | POA: Diagnosis not present

## 2024-02-16 DIAGNOSIS — M47819 Spondylosis without myelopathy or radiculopathy, site unspecified: Secondary | ICD-10-CM | POA: Diagnosis not present

## 2024-02-16 DIAGNOSIS — Z853 Personal history of malignant neoplasm of breast: Secondary | ICD-10-CM | POA: Diagnosis not present

## 2024-02-16 DIAGNOSIS — M810 Age-related osteoporosis without current pathological fracture: Secondary | ICD-10-CM | POA: Diagnosis not present

## 2024-02-16 DIAGNOSIS — Z79899 Other long term (current) drug therapy: Secondary | ICD-10-CM | POA: Diagnosis not present
